# Patient Record
Sex: Female | Born: 1985 | Race: White | Hispanic: No | Marital: Married | Smoking: Never smoker
Health system: Southern US, Community
[De-identification: ages and names within clinical notes are randomized; demographics above are authoritative.]

## PROBLEM LIST (undated history)

## (undated) DIAGNOSIS — T7840XA Allergy, unspecified, initial encounter: Secondary | ICD-10-CM

## (undated) DIAGNOSIS — I10 Essential (primary) hypertension: Secondary | ICD-10-CM

## (undated) DIAGNOSIS — F419 Anxiety disorder, unspecified: Secondary | ICD-10-CM

## (undated) HISTORY — PX: CHOLECYSTECTOMY: SHX55

## (undated) HISTORY — DX: Allergy, unspecified, initial encounter: T78.40XA

## (undated) HISTORY — PX: TONSILLECTOMY: SUR1361

---

## 2011-07-18 DIAGNOSIS — F411 Generalized anxiety disorder: Secondary | ICD-10-CM | POA: Insufficient documentation

## 2011-07-18 DIAGNOSIS — I10 Essential (primary) hypertension: Secondary | ICD-10-CM | POA: Insufficient documentation

## 2014-12-13 DIAGNOSIS — J40 Bronchitis, not specified as acute or chronic: Secondary | ICD-10-CM | POA: Insufficient documentation

## 2014-12-13 DIAGNOSIS — J029 Acute pharyngitis, unspecified: Secondary | ICD-10-CM | POA: Insufficient documentation

## 2015-02-02 DIAGNOSIS — F411 Generalized anxiety disorder: Secondary | ICD-10-CM | POA: Insufficient documentation

## 2015-02-02 DIAGNOSIS — T7840XA Allergy, unspecified, initial encounter: Secondary | ICD-10-CM | POA: Insufficient documentation

## 2015-02-28 DIAGNOSIS — M5430 Sciatica, unspecified side: Secondary | ICD-10-CM | POA: Insufficient documentation

## 2015-04-24 DIAGNOSIS — J0100 Acute maxillary sinusitis, unspecified: Secondary | ICD-10-CM | POA: Insufficient documentation

## 2015-08-01 DIAGNOSIS — M79671 Pain in right foot: Secondary | ICD-10-CM | POA: Insufficient documentation

## 2015-08-01 DIAGNOSIS — R197 Diarrhea, unspecified: Secondary | ICD-10-CM | POA: Insufficient documentation

## 2015-08-01 DIAGNOSIS — R140 Abdominal distension (gaseous): Secondary | ICD-10-CM | POA: Insufficient documentation

## 2015-10-31 DIAGNOSIS — E669 Obesity, unspecified: Secondary | ICD-10-CM | POA: Insufficient documentation

## 2016-09-25 DIAGNOSIS — S86819A Strain of other muscle(s) and tendon(s) at lower leg level, unspecified leg, initial encounter: Secondary | ICD-10-CM | POA: Insufficient documentation

## 2017-04-16 NOTE — L&D Delivery Note (Signed)
Operative Delivery Note At 11:52 AM a viable and healthy female was delivered via Vaginal, [employer], operational(Extractor).  Presentation: vertex; Position: Occiput,, Anterior; Station: +3.  Verbal consent: obtained from patient.  Risks and benefits discussed in detail.  Risks include, but are not limited to the risks of anesthesia, bleeding, infection, damage to maternal tissues, fetal cephalhematoma.  There is also the risk of inability to effect vaginal delivery of the head, or shoulder dystocia that cannot be resolved by established maneuvers, leading to the need for emergency cesarean section.  APGAR: 8, 9; weight  pending.   Placenta status: spontaneous, intact.   Cord: shoulder cord with the following complications: none.  Cord pH: na  Anesthesia:  Epidural x 3 pulls, no pop offs Instruments: kiwi Episiotomy: None Lacerations: 2nd degree;Perineal Suture Repair: 2.0 vicryl rapide Est. Blood Loss (mL):  200  Mom to postpartum.  Baby to Couplet care / Skin to Skin.  Yiannis Tulloch J 04/09/2018, 12:05 PM

## 2017-09-23 DIAGNOSIS — Z3A10 10 weeks gestation of pregnancy: Secondary | ICD-10-CM | POA: Diagnosis not present

## 2017-09-23 DIAGNOSIS — O09891 Supervision of other high risk pregnancies, first trimester: Secondary | ICD-10-CM | POA: Diagnosis not present

## 2017-09-30 DIAGNOSIS — Z8679 Personal history of other diseases of the circulatory system: Secondary | ICD-10-CM | POA: Diagnosis not present

## 2017-10-07 DIAGNOSIS — O09291 Supervision of pregnancy with other poor reproductive or obstetric history, first trimester: Secondary | ICD-10-CM | POA: Diagnosis not present

## 2017-11-07 DIAGNOSIS — O09292 Supervision of pregnancy with other poor reproductive or obstetric history, second trimester: Secondary | ICD-10-CM | POA: Diagnosis not present

## 2017-11-07 DIAGNOSIS — Z3A16 16 weeks gestation of pregnancy: Secondary | ICD-10-CM | POA: Diagnosis not present

## 2017-12-19 DIAGNOSIS — O09292 Supervision of pregnancy with other poor reproductive or obstetric history, second trimester: Secondary | ICD-10-CM | POA: Diagnosis not present

## 2017-12-19 DIAGNOSIS — Z3A22 22 weeks gestation of pregnancy: Secondary | ICD-10-CM | POA: Diagnosis not present

## 2018-01-03 DIAGNOSIS — Z3A25 25 weeks gestation of pregnancy: Secondary | ICD-10-CM | POA: Diagnosis not present

## 2018-01-03 DIAGNOSIS — O23592 Infection of other part of genital tract in pregnancy, second trimester: Secondary | ICD-10-CM | POA: Diagnosis not present

## 2018-01-03 DIAGNOSIS — B9689 Other specified bacterial agents as the cause of diseases classified elsewhere: Secondary | ICD-10-CM | POA: Insufficient documentation

## 2018-01-03 DIAGNOSIS — N760 Acute vaginitis: Secondary | ICD-10-CM

## 2018-01-03 DIAGNOSIS — O26852 Spotting complicating pregnancy, second trimester: Secondary | ICD-10-CM | POA: Diagnosis not present

## 2018-01-03 DIAGNOSIS — O26859 Spotting complicating pregnancy, unspecified trimester: Secondary | ICD-10-CM

## 2018-01-03 NOTE — MAU Note (Signed)
Went to the bathroom around 1530, noted some red blood on the tissue. (dime spot). Happened a 2nd time, instructed to come here for eval.  Has felt movement all day. No pain.  Has been constipated

## 2018-01-03 NOTE — Discharge Instructions (Signed)
Bacterial Vaginosis Bacterial vaginosis is an infection of the vagina. It happens when too many germs (bacteria) grow in the vagina. This infection puts you at risk for infections from sex (STIs). Treating this infection can lower your risk for some STIs. You should also treat this if you are pregnant. It can cause your baby to be born early. Follow these instructions at home: Medicines  Take over-the-counter and prescription medicines only as told by your doctor.  Take or use your antibiotic medicine as told by your doctor. Do not stop taking or using it even if you start to feel better. General instructions  If you your sexual partner is a woman, tell her that you have this infection. She needs to get treatment if she has symptoms. If you have a female partner, he does not need to be treated.  During treatment: ? Avoid sex. ? Do not douche. ? Avoid alcohol as told. ? Avoid breastfeeding as told.  Drink enough fluid to keep your pee (urine) clear or pale yellow.  Keep your vagina and butt (rectum) clean. ? Wash the area with warm water every day. ? Wipe from front to back after you use the toilet.  Keep all follow-up visits as told by your doctor. This is important. Preventing this condition  Do not douche.  Use only warm water to wash around your vagina.  Use protection when you have sex. This includes: ? Latex condoms. ? Dental dams.  Limit how many people you have sex with. It is best to only have sex with the same person (be monogamous).  Get tested for STIs. Have your partner get tested.  Wear underwear that is cotton or lined with cotton.  Avoid tight pants and pantyhose. This is most important in summer.  Do not use any products that have nicotine or tobacco in them. These include cigarettes and e-cigarettes. If you need help quitting, ask your doctor.  Do not use illegal drugs.  Limit how much alcohol you drink. Contact a doctor if:  Your symptoms do not get  better, even after you are treated.  You have more discharge or pain when you pee (urinate).  You have a fever.  You have pain in your belly (abdomen).  You have pain with sex.  Your bleed from your vagina between periods. Summary  This infection happens when too many germs (bacteria) grow in the vagina.  Treating this condition can lower your risk for some infections from sex (STIs).  You should also treat this if you are pregnant. It can cause early (premature) birth.  Do not stop taking or using your antibiotic medicine even if you start to feel better. This information is not intended to replace advice given to you by your health care provider. Make sure you discuss any questions you have with your health care provider. Document Released: 01/10/2008 Document Revised: 12/17/2015 Document Reviewed: 12/17/2015 Elsevier Interactive Patient Education  2017 Elsevier Inc.  [employer] of the uterus can occur throughout pregnancy, but they are not always a sign that you are in labor. You may have practice contractions called Braxton Hicks contractions. These false labor contractions are sometimes confused with true labor. What are Deberah Pelton contractions? Braxton Hicks contractions are tightening movements that occur in the muscles of the uterus before labor. Unlike true labor contractions, these contractions do not result in opening (dilation) and thinning of the cervix. Toward the end of pregnancy (32-34 weeks), Braxton Hicks contractions can happen more often  and may become stronger. These contractions are sometimes difficult to tell apart from true labor because they can be very uncomfortable. You should not feel embarrassed if you go to the hospital with false labor. Sometimes, the only way to tell if you are in true labor is for your health care provider to look for changes in the cervix. The health care provider will do a physical exam and may monitor  your contractions. If you are not in true labor, the exam should show that your cervix is not dilating and your water has not broken. If there are other health problems associated with your pregnancy, it is completely safe for you to be sent home with false labor. You may continue to have Braxton Hicks contractions until you go into true labor. How to tell the difference between true labor and false labor True labor  Contractions last 30-70 seconds.  Contractions become very regular.  Discomfort is usually felt in the top of the uterus, and it spreads to the lower abdomen and low back.  Contractions do not go away with walking.  Contractions usually become more intense and increase in frequency.  The cervix dilates and gets thinner. False labor  Contractions are usually shorter and not as strong as true labor contractions.  Contractions are usually irregular.  Contractions are often felt in the front of the lower abdomen and in the groin.  Contractions may go away when you walk around or change positions while lying down.  Contractions get weaker and are shorter-lasting as time goes on.  The cervix usually does not dilate or become thin. Follow these instructions at home:  Take over-the-counter and prescription medicines only as told by your health care provider.  Keep up with your usual exercises and follow other instructions from your health care provider.  Eat and drink lightly if you think you are going into labor.  If Braxton Hicks contractions are making you uncomfortable: ? Change your position from lying down or resting to walking, or change from walking to resting. ? Sit and rest in a tub of warm water. ? Drink enough fluid to keep your urine pale yellow. Dehydration may cause these contractions. ? Do slow and deep breathing several times an hour.  Keep all follow-up prenatal visits as told by your health care provider. This is important. Contact a health care  provider if:  You have a fever.  You have continuous pain in your abdomen. Get help right away if:  Your contractions become stronger, more regular, and closer together.  You have fluid leaking or gushing from your vagina.  You pass blood-tinged mucus (bloody show).  You have bleeding from your vagina.  You have low back pain that you never had before.  You feel your babys head pushing down and causing pelvic pressure.  Your baby is not moving inside you as much as it used to. Summary  Contractions that occur before labor are called Braxton Hicks contractions, false labor, or practice contractions.  Braxton Hicks contractions are usually shorter, weaker, farther apart, and less regular than true labor contractions. True labor contractions usually become progressively stronger and regular and they become more frequent.  Manage discomfort from [facility] contractions by changing position, resting in a warm bath, drinking plenty of water, or practicing deep breathing. This information is not intended to replace advice given to you by your health care provider. Make sure you discuss any questions you have with your health care provider. Document Released: 08/16/2016  Document Revised: 08/16/2016 Document Reviewed: 08/16/2016 Elsevier Interactive Patient Education  [employer]2018 Elsevier Inc.

## 2018-01-03 NOTE — MAU Provider Note (Signed)
History     CSN: [id number]  Arrival date and time: 01/03/18 1640   First Provider Initiated Contact with Patient 01/03/18 1743      Chief Complaint  Patient presents with  . Vaginal Bleeding   HPI  Melissa Oneal is a 32 y.o. G1P0 at 6262w0d who presents to MAU today with complaint of 2 episodes of red spotting on the tissue today between 3 and 4pm. She denies any blood in her underwear. She has not needed a pad or panty liner. She denies abdominal pain, contractions or UTI symptoms. She has had frequent constipation. She reports normal fetal movement.   OB History    Gravida  1   Para      Term      Preterm      AB      Living        SAB      TAB      Ectopic      Multiple      Live Births              History reviewed. No pertinent past medical history.  Past Surgical History:  Procedure Laterality Date  . CHOLECYSTECTOMY    . TONSILLECTOMY      History reviewed. No pertinent family history.  Social History   Tobacco Use  . Smoking status: Never Smoker  . Smokeless tobacco: Never Used  Substance Use Topics  . Alcohol use: Never    Frequency: Never  . Drug use: Never    Allergies:  Allergies  Allergen Reactions  . Cephalosporins Rash    No medications prior to admission.    Review of Systems  Constitutional: Negative for fever.  Gastrointestinal: Positive for constipation. Negative for abdominal pain, diarrhea, nausea and vomiting.  Genitourinary: Positive for vaginal bleeding. Negative for dysuria, frequency, urgency and vaginal discharge.   Physical Exam   Blood pressure 118/75, pulse 77, temperature 98.5 F (36.9 C), temperature source Oral, resp. rate 16, weight 89.1 kg, SpO2 100 %.  Physical Exam  Nursing note and vitals reviewed. Constitutional: She is oriented to person, place, and time. She appears well-developed and well-nourished. No distress.  HENT:  Head: Normocephalic and atraumatic.  Cardiovascular: Normal  rate.  Respiratory: Effort normal.  GI: Soft. She exhibits no distension and no mass. There is no tenderness. There is no rebound and no guarding.  Genitourinary: Uterus is enlarged. Cervix exhibits no motion tenderness, no discharge and no friability. No bleeding in the vagina. Vaginal discharge (small white discharge) found.  Neurological: She is alert and oriented to person, place, and time.  Skin: Skin is warm and dry. No erythema.  Psychiatric: She has a normal mood and affect.   Results for orders placed or performed during the hospital encounter of 01/03/18 (from the past 24 hour(s))  Wet prep, genital     Status: Abnormal   Collection Time: 01/03/18  6:16 PM  Result Value Ref Range   Yeast Wet Prep HPF POC NONE SEEN NONE SEEN   Trich, Wet Prep NONE SEEN NONE SEEN   Clue Cells Wet Prep HPF POC PRESENT (A) NONE SEEN   WBC, Wet Prep HPF POC MANY (A) NONE SEEN   Sperm NONE SEEN   Urinalysis, Routine w reflex microscopic     Status: Abnormal   Collection Time: 01/03/18  6:50 PM  Result Value Ref Range   Color, Urine YELLOW YELLOW   APPearance CLEAR CLEAR   Specific Gravity, Urine  1.011 1.005 - 1.030   pH 6.0 5.0 - 8.0   Glucose, UA NEGATIVE NEGATIVE mg/dL   Hgb urine dipstick NEGATIVE NEGATIVE   Bilirubin Urine NEGATIVE NEGATIVE   Ketones, ur NEGATIVE NEGATIVE mg/dL   Protein, ur NEGATIVE NEGATIVE mg/dL   Nitrite NEGATIVE NEGATIVE   Leukocytes, UA TRACE (A) NEGATIVE   RBC / HPF 0-5 0 - 5 RBC/hpf   WBC, UA 0-5 0 - 5 WBC/hpf   Bacteria, UA RARE (A) NONE SEEN   Squamous Epithelial / LPF 0-5 0 - 5   Mucus PRESENT    Fetal Monitoring: Baseline: 135 bpm Variability: moderate Accelerations: 15 x 15, 10 x 10 Decelerations: none Contractions: none   MAU Course  Procedures None  MDM UA and wet prep today Discussed patient with Dr. Amado Nash. She recommends treatment of wet prep, pelvic rest and follow-up in the office next week.   Assessment and Plan  A: SIUP at  [redacted]w[redacted]d Bacterial vaginosis  Spotting in pregnancy, second trimester   P:  Discharge home Rx for Flagyl given to patient  Bleeding/preterm labor precautions discussed Pelvic rest advised  Patient advised to follow-up with [facility] as scheduled next week Patient may return to MAU as needed or if her condition were to change or worsen  Vonzella Nipple, PA-C 01/03/2018, 7:26 PM

## 2018-02-10 DIAGNOSIS — Z3A30 30 weeks gestation of pregnancy: Secondary | ICD-10-CM | POA: Diagnosis not present

## 2018-02-10 DIAGNOSIS — O368130 Decreased fetal movements, third trimester, not applicable or unspecified: Secondary | ICD-10-CM | POA: Diagnosis not present

## 2018-02-18 DIAGNOSIS — O368130 Decreased fetal movements, third trimester, not applicable or unspecified: Secondary | ICD-10-CM | POA: Diagnosis not present

## 2018-02-18 DIAGNOSIS — Z3A31 31 weeks gestation of pregnancy: Secondary | ICD-10-CM | POA: Diagnosis not present

## 2018-02-27 DIAGNOSIS — O09293 Supervision of pregnancy with other poor reproductive or obstetric history, third trimester: Secondary | ICD-10-CM | POA: Diagnosis not present

## 2018-02-27 DIAGNOSIS — Z3A32 32 weeks gestation of pregnancy: Secondary | ICD-10-CM | POA: Diagnosis not present

## 2018-03-17 DIAGNOSIS — O09293 Supervision of pregnancy with other poor reproductive or obstetric history, third trimester: Secondary | ICD-10-CM | POA: Diagnosis not present

## 2018-03-18 DIAGNOSIS — Z3400 Encounter for supervision of normal first pregnancy, unspecified trimester: Secondary | ICD-10-CM | POA: Diagnosis not present

## 2018-03-18 NOTE — Telephone Encounter (Signed)
Spoke with patient, she stated she was going to speak with her husband and call us back.

## 2018-03-18 NOTE — Telephone Encounter (Signed)
Copied from CRM [id number]. Topic: Appointment Scheduling - New Patient >> Mar 17, 2018  3:03 PM Elliot GaultBell, Tiffany M wrote: Relation to pt: self  Call back number: [phone]    Reason for call:  Lenny PastelMueller, Nicole S - [phone] patient of Dr. Selena BattenKim referred patient to establish care. Patient currently 35 weeks and she is under the care of her OBGYN, please advise

## 2018-03-18 NOTE — Telephone Encounter (Signed)
Please let her know I am part-time and pretty full right now.  If  this is issue would recommend 1 of our other providers, Dr. Salomon FickBanks, Dr. Ardyth HarpsHernandez or Dr. Hassan RowanKoberlein.  If she prefers to see me, would schedule new patient visit once she has recovered from the pregnancy.  Typically during the pregnancy her obstetrician would manage her concerns.

## 2018-03-31 DIAGNOSIS — O26893 Other specified pregnancy related conditions, third trimester: Secondary | ICD-10-CM | POA: Diagnosis not present

## 2018-03-31 DIAGNOSIS — Z3A37 37 weeks gestation of pregnancy: Secondary | ICD-10-CM | POA: Insufficient documentation

## 2018-03-31 DIAGNOSIS — R030 Elevated blood-pressure reading, without diagnosis of hypertension: Secondary | ICD-10-CM | POA: Insufficient documentation

## 2018-03-31 DIAGNOSIS — O163 Unspecified maternal hypertension, third trimester: Secondary | ICD-10-CM

## 2018-03-31 DIAGNOSIS — Z3689 Encounter for other specified antenatal screening: Secondary | ICD-10-CM

## 2018-03-31 HISTORY — DX: Anxiety disorder, unspecified: F41.9

## 2018-03-31 HISTORY — DX: Essential (primary) hypertension: I10

## 2018-03-31 NOTE — MAU Note (Signed)
Dr has been watching her blood pressure for a little bit, was up today called and was seen in the office, sent over for further eval.  Denies HA, visual changes or epigastric pain, some increase in swelling in hands at night.feet and ankles are now starting to swell

## 2018-03-31 NOTE — Discharge Instructions (Signed)

## 2018-03-31 NOTE — MAU Provider Note (Signed)
History     CSN: [id number]  Arrival date and time: 03/31/18 1255   First Provider Initiated Contact with Patient 03/31/18 1337      Chief Complaint  Patient presents with  . Hypertension   HPI Melissa Oneal is a 32 y.o. G1P0 at [redacted]w[redacted]d sent from clinic for elevated blood pressure. Patient states her [facility] Provider has been following her blood pressure for a couple weeks due to history of chronic hypertension which resolved with weight loss. Patient endorses blood pressure of 140/90 on two occasions last weekend as well as a headache which resolved without intervention. She was seen in clinic this morning for followup and determined to have blood pressure of 158/77. Patient denies headache, RUQ pain, SOB, visual disturbances, abdominal pain, DFM, fever or recent illness.  Patient also voices concern about nighttime swelling in her hands and intermittent bilateral swelling in her lower extremities. She denies facial swelling.  OB History    Gravida  1   Para      Term      Preterm      AB      Living        SAB      TAB      Ectopic      Multiple      Live Births              Past Medical History:  Diagnosis Date  . Anxiety   . Hypertension     Past Surgical History:  Procedure Laterality Date  . CHOLECYSTECTOMY    . TONSILLECTOMY      No family history on file.  Social History   Tobacco Use  . Smoking status: Never Smoker  . Smokeless tobacco: Never Used  Substance Use Topics  . Alcohol use: Never    Frequency: Never  . Drug use: Never    Allergies:  Allergies  Allergen Reactions  . Cephalosporins Rash    Medications Prior to Admission  Medication Sig Dispense Refill Last Dose  . metroNIDAZOLE (FLAGYL) 500 MG tablet Take 1 tablet (500 mg total) by mouth 2 (two) times daily. 14 tablet 0     Review of Systems  Constitutional: Negative for chills, fatigue and fever.  HENT: Negative for facial swelling.   Respiratory: Negative for  shortness of breath.   Gastrointestinal: Negative for abdominal pain, nausea and vomiting.  Genitourinary: Negative for difficulty urinating, dyspareunia and flank pain.  Musculoskeletal: Negative for back pain.  Neurological: Negative for dizziness, syncope and headaches.  All other systems reviewed and are negative.  Physical Exam   Blood pressure 137/88, pulse 92, temperature 98.5 F (36.9 C), temperature source Oral, resp. rate 18, height 5\' 6"  (1.676 m), weight 99.5 kg, SpO2 100 %.  Physical Exam  Nursing note and vitals reviewed. Constitutional: She is oriented to person, place, and time. She appears well-developed and well-nourished.  Cardiovascular: Normal rate, normal heart sounds and intact distal pulses.  Respiratory: Effort normal and breath sounds normal.  GI: Soft. She exhibits no distension. There is no abdominal tenderness. There is no rebound and no guarding.  Gravid  Genitourinary:    Vagina and uterus normal.     No vaginal discharge.   Musculoskeletal: Normal range of motion.  Neurological: She is oriented to person, place, and time.  Skin: Skin is warm and dry.  Psychiatric: She has a normal mood and affect. Her behavior is normal. Judgment and thought content normal.   Cervix Closed/thick/posterior  MAU  Course/MDM   --Reactive fetal tracing: baseline 140, moderate variability, positive accelerations, no decelerations --Toco: uterine irritability, rare contraction not felt by patient --Elevated BP x 2, no severe range. No severe symptoms --Discussed HPI and lab results with Dr. Juliene PinaMody. Plan for discharge with continued home blood pressure readings. Patient has OB appt Thursday but should call clinic or present to MAU for elevated BP or occurrence of severe symptoms --Elevated WBCs discussed with patient and Dr. Juliene PinaMody. Patient denies illness, urinary symptoms, fever  Patient Vitals for the past 24 hrs:  BP Temp Temp src Pulse Resp SpO2 Height Weight  03/31/18  1516 129/83 - - 88 - - - -  03/31/18 1501 137/88 - - 86 - - - -  03/31/18 1456 135/77 - - 85 - - - -  03/31/18 1431 138/87 - - 90 - - - -  03/31/18 1416 139/83 - - 88 - - - -  03/31/18 1401 134/81 - - 86 - - - -  03/31/18 1346 (!) 141/76 - - 89 - - - -  03/31/18 1331 137/88 - - 92 - - - -  03/31/18 1316 (!) 146/88 98.5 F (36.9 C) Oral 89 18 100 % 5\' 6"  (1.676 m) 99.5 kg    Results for orders placed or performed during the hospital encounter of 03/31/18 (from the past 24 hour(s))  Urinalysis, Routine w reflex microscopic     Status: Abnormal   Collection Time: 03/31/18  1:27 PM  Result Value Ref Range   Color, Urine YELLOW YELLOW   APPearance CLEAR CLEAR   Specific Gravity, Urine <1.005 (L) 1.005 - 1.030   pH 6.0 5.0 - 8.0   Glucose, UA NEGATIVE NEGATIVE mg/dL   Hgb urine dipstick NEGATIVE NEGATIVE   Bilirubin Urine NEGATIVE NEGATIVE   Ketones, ur 15 (A) NEGATIVE mg/dL   Protein, ur NEGATIVE NEGATIVE mg/dL   Nitrite NEGATIVE NEGATIVE   Leukocytes, UA MODERATE (A) NEGATIVE  Protein / creatinine ratio, urine     Status: None   Collection Time: 03/31/18  1:27 PM  Result Value Ref Range   Creatinine, Urine 60.00 mg/dL   Total Protein, Urine 6 mg/dL   Protein Creatinine Ratio 0.10 0.00 - 0.15 mg/mg[Cre]  Urinalysis, Microscopic (reflex)     Status: Abnormal   Collection Time: 03/31/18  1:27 PM  Result Value Ref Range   RBC / HPF 0-5 0 - 5 RBC/hpf   WBC, UA 6-10 0 - 5 WBC/hpf   Bacteria, UA FEW (A) NONE SEEN   Squamous Epithelial / LPF 6-10 0 - 5  CBC with Differential/Platelet     Status: Abnormal   Collection Time: 03/31/18  1:58 PM  Result Value Ref Range   WBC 15.0 (H) 4.0 - 10.5 K/uL   RBC 3.62 (L) 3.87 - 5.11 MIL/uL   Hemoglobin 11.6 (L) 12.0 - 15.0 g/dL   HCT 40.933.7 (L) 81.136.0 - 91.446.0 %   MCV 93.1 80.0 - 100.0 fL   MCH 32.0 26.0 - 34.0 pg   MCHC 34.4 30.0 - 36.0 g/dL   RDW 78.213.5 95.611.5 - 21.315.5 %   Platelets 218 150 - 400 K/uL   nRBC 0.0 0.0 - 0.2 %   Neutrophils Relative %  72 %   Neutro Abs 10.9 (H) 1.7 - 7.7 K/uL   Lymphocytes Relative 21 %   Lymphs Abs 3.1 0.7 - 4.0 K/uL   Monocytes Relative 6 %   Monocytes Absolute 0.9 0.1 - 1.0 K/uL   Eosinophils Relative  1 %   Eosinophils Absolute 0.1 0.0 - 0.5 K/uL   Basophils Relative 0 %   Basophils Absolute 0.1 0.0 - 0.1 K/uL  Comprehensive metabolic panel     Status: Abnormal   Collection Time: 03/31/18  1:58 PM  Result Value Ref Range   Sodium 136 135 - 145 mmol/L   Potassium 3.1 (L) 3.5 - 5.1 mmol/L   Chloride 106 98 - 111 mmol/L   CO2 22 22 - 32 mmol/L   Glucose, Bld 120 (H) 70 - 99 mg/dL   BUN 6 6 - 20 mg/dL   Creatinine, Ser 1.61 0.44 - 1.00 mg/dL   Calcium 8.6 (L) 8.9 - 10.3 mg/dL   Total Protein 5.5 (L) 6.5 - 8.1 g/dL   Albumin 3.1 (L) 3.5 - 5.0 g/dL   AST 17 15 - 41 U/L   ALT 12 0 - 44 U/L   Alkaline Phosphatase 63 38 - 126 U/L   Total Bilirubin 0.8 0.3 - 1.2 mg/dL   GFR calc non Af Amer >60 >60 mL/min   GFR calc Af Amer >60 >60 mL/min   Anion gap 8 5 - 15     Assessment and Plan  --32 y.o. G1P0 at [redacted]w[redacted]d  --Reactive fetal tracing, closed cervix --Negative PEC labs --Concerning signs and symptoms reviewed with patient and restated in AVS --Discharge home in stable condition   F/U: Existing appt Thursday 04/03/18  Plan of care discussed with and approved by Dr. Rande Lawman, CNM 03/31/2018, 4:34 PM

## 2018-04-09 DIAGNOSIS — Z3A38 38 weeks gestation of pregnancy: Secondary | ICD-10-CM | POA: Diagnosis not present

## 2018-04-09 DIAGNOSIS — O99344 Other mental disorders complicating childbirth: Secondary | ICD-10-CM | POA: Diagnosis present

## 2018-04-09 DIAGNOSIS — F419 Anxiety disorder, unspecified: Secondary | ICD-10-CM | POA: Diagnosis present

## 2018-04-09 DIAGNOSIS — O9081 Anemia of the puerperium: Secondary | ICD-10-CM | POA: Diagnosis not present

## 2018-04-09 DIAGNOSIS — Z3A39 39 weeks gestation of pregnancy: Secondary | ICD-10-CM | POA: Diagnosis not present

## 2018-04-09 DIAGNOSIS — O139 Gestational [pregnancy-induced] hypertension without significant proteinuria, unspecified trimester: Principal | ICD-10-CM | POA: Diagnosis present

## 2018-04-09 DIAGNOSIS — D62 Acute posthemorrhagic anemia: Secondary | ICD-10-CM | POA: Diagnosis not present

## 2018-04-09 DIAGNOSIS — Z3483 Encounter for supervision of other normal pregnancy, third trimester: Secondary | ICD-10-CM | POA: Diagnosis not present

## 2018-04-09 MED ADMIN — Lactated Ringer's Solution: INTRAVENOUS | @ 09:00:00 | NDC 00338011704

## 2018-04-09 MED ADMIN — Lactated Ringer's Solution: INTRAVENOUS | @ 06:00:00 | NDC 00338011704

## 2018-04-09 NOTE — MAU Note (Signed)
Pt reporting SROM at 0245 clear/pink. Denies bleeding, has had period-like cramps for weeks. +FM.  Is scheduled for IOL 12-26 for GHTN.

## 2018-04-09 NOTE — Anesthesia Preprocedure Evaluation (Signed)
Anesthesia Evaluation  Patient identified by MRN, date of birth, ID band Patient awake    Reviewed: Allergy & Precautions, Patient's Chart, lab work & pertinent test results  Airway Mallampati: II  TM Distance: >3 FB     Dental   Pulmonary neg pulmonary ROS,    breath sounds clear to auscultation       Cardiovascular hypertension,  Rhythm:Regular Rate:Normal     Neuro/Psych negative neurological ROS     GI/Hepatic negative GI ROS, Neg liver ROS,   Endo/Other  negative endocrine ROS  Renal/GU negative Renal ROS     Musculoskeletal   Abdominal   Peds  Hematology negative hematology ROS (+)   Anesthesia Other Findings   Reproductive/Obstetrics (+) Pregnancy                             Lab Results  Component Value Date   WBC 16.9 (H) 04/09/2018   HGB 12.6 04/09/2018   HCT 36.8 04/09/2018   MCV 92.7 04/09/2018   PLT 257 04/09/2018    Anesthesia Physical Anesthesia Plan  ASA: II  Anesthesia Plan: Epidural   Post-op Pain Management:    Induction:   PONV Risk Score and Plan: Treatment may vary due to age or medical condition  Airway Management Planned: Natural Airway  Additional Equipment:   Intra-op Plan:   Post-operative Plan:   Informed Consent: I have reviewed the patients History and Physical, chart, labs and discussed the procedure including the risks, benefits and alternatives for the proposed anesthesia with the patient or authorized representative who has indicated his/her understanding and acceptance.     Plan Discussed with:   Anesthesia Plan Comments:         Anesthesia Quick Evaluation

## 2018-04-09 NOTE — Lactation Note (Signed)
This note was copied from a baby's chart. Lactation Consultation Note  Patient Name: Melissa Oneal ZOXWR'UToday's Date: 04/09/2018 Reason for consult: Initial assessment;1st time breastfeeding;Early term 37-38.6wks P1, 9 hour female infant. Per mom, she attended breastfeeding classes in her pregnancy. Per mom, has Medela DEBP which she brought with her from home. Per mom, infant did not latch during L&D. Infant has two emesis episodes with LC, infant was suction and burped, emesis was large , frothy and with mucous. LC alerted nurse of emesis. Mom was given a manual pump and explained how to use by nurse. Mom hand expressed 1 ml of colostrum that was spoon feed to infant. LC notice mom's right breast is short shafted, [facility] and explained how to use [facility], mom knows to wear in bra during the day and not sleep in breast shells during  the night. Mom latched infant to left breast using the foot ball hold, infant open mouth with wide gape with chin expended downward, swallowing observed by LC infant was breastfeeding for 10 minutes. Infant was still breastfeeding as LC left room.  Mom will breastfeed according hunger cues, 8 to 12 times within 24 hours and not exceed 3 hours without breastfeeding infant. Mom made aware of O/P services, breastfeeding support groups, community resources, and our phone # for post-discharge questions.  Mom's breastfeeding plan: 1. Mom will breastfeed according hunger cues, 8 to 12 times within 24 hours and not exceed 3 hours without breastfeeding infant. 2. Mom will continue to do STS as much as possible. 3. Mom call nurse and LC if she has any further questions, concerns or need assistance with latching infant to breast.    Maternal Data Formula Feeding for Exclusion: Yes Reason for exclusion: Mother's choice to formula and breast feed on admission Has patient been taught Hand Expression?: Yes(Mom has colostrum present both breast.) Does  the patient have breastfeeding experience prior to this delivery?: No  Feeding Feeding Type: Breast Fed  LATCH Score Latch: Grasps breast easily, tongue down, lips flanged, rhythmical sucking.  Audible Swallowing: Spontaneous and intermittent  Type of Nipple: Everted at rest and after stimulation(right breast is short shafted )  Comfort (Breast/Nipple): Soft / non-tender  Hold (Positioning): Assistance needed to correctly position infant at breast and maintain latch.  LATCH Score: 9  Interventions Interventions: Breast feeding basics reviewed;Pre-pump if needed;Assisted with latch;Skin to skin;Breast compression;Breast massage;Adjust position;Shells;Hand pump  Lactation Tools Discussed/Used WIC Program: No Pump Review: Setup, frequency, and cleaning;Milk Storage Initiated by:: by Nurse Date initiated:: 04/10/18   Consult Status Consult Status: Follow-up Date: 04/10/18 Follow-up type: In-patient    Melissa Oneal 04/09/2018, 9:37 PM

## 2018-04-09 NOTE — Anesthesia Postprocedure Evaluation (Signed)
Anesthesia Post Note  Patient: Melissa RanaHeather Oneal  Procedure(s) Performed: AN AD HOC LABOR EPIDURAL     Patient location during evaluation: Mother Baby Anesthesia Type: Epidural Level of consciousness: awake and alert Pain management: pain level controlled Vital Signs Assessment: post-procedure vital signs reviewed and stable Respiratory status: spontaneous breathing, nonlabored ventilation and respiratory function stable Cardiovascular status: stable Postop Assessment: no headache, no backache and epidural receding Anesthetic complications: no    Last Vitals:  Vitals:   04/09/18 1332 04/09/18 1415  BP: 128/75 130/86  Pulse: 86 85  Resp: 18 18  Temp:  37.2 C  SpO2:  96%    Last Pain:  Vitals:   04/09/18 1415  TempSrc:   PainSc: 0-No pain   Pain Goal:                 Kennadee Walthour

## 2018-04-09 NOTE — MAU Note (Signed)
Phoned Dr Billy Coastaavon regarding [facility]'s BP [facility] He ordered CMP and other standard labor admission orders.  [facility]

## 2018-04-09 NOTE — Anesthesia Procedure Notes (Signed)
Epidural Patient location during procedure: OB Start time: 04/09/2018 6:28 AM End time: 04/09/2018 6:34 AM  Staffing Anesthesiologist: Marcene DuosFitzgerald, Cameo Schmiesing, MD Performed: anesthesiologist   Preanesthetic Checklist Completed: patient identified, site marked, surgical consent, pre-op evaluation, timeout performed, IV checked, risks and benefits discussed and monitors and equipment checked  Epidural Patient position: right lateral decubitus Prep: site prepped and draped and DuraPrep Patient monitoring: continuous pulse ox and blood pressure Approach: midline Location: L4-L5 Injection technique: LOR air  Needle:  Needle type: Tuohy  Needle gauge: 17 G Needle length: 9 cm and 9 Needle insertion depth: 6 cm Catheter type: closed end flexible Catheter size: 19 Gauge Catheter at skin depth: 11 cm Test dose: negative  Assessment Events: blood not aspirated, injection not painful, no injection resistance, negative IV test and no paresthesia

## 2018-04-09 NOTE — H&P (Signed)
Melissa Oneal is a 32 y.o. female presenting for labor. OB History    Gravida  1   Para      Term      Preterm      AB      Living        SAB      TAB      Ectopic      Multiple      Live Births             Past Medical History:  Diagnosis Date  . Anxiety   . Hypertension    Past Surgical History:  Procedure Laterality Date  . CHOLECYSTECTOMY    . TONSILLECTOMY     Family History: family history is not on file. Social History:  reports that she has never smoked. She has never used smokeless tobacco. She reports that she does not drink alcohol or use drugs.     Maternal Diabetes: No Genetic Screening: Normal Maternal Ultrasounds/Referrals: Normal Fetal Ultrasounds or other Referrals:  None Maternal Substance Abuse:  No Significant Maternal Medications:  None Significant Maternal Lab Results:  None Other Comments:  None  Review of Systems  Constitutional: Negative.   All other systems reviewed and are negative.  Maternal Medical History:  Reason for admission: Contractions.   Contractions: Onset was 3-5 hours ago.   Frequency: irregular.   Perceived severity is moderate.    Fetal activity: Perceived fetal activity is normal.   Last perceived fetal movement was within the past hour.    Prenatal complications: PIH.   Prenatal Complications - Diabetes: none.    CBC    Component Value Date/Time   WBC 16.9 (H) 04/09/2018 0513   RBC 3.97 04/09/2018 0513   HGB 12.6 04/09/2018 0513   HCT 36.8 04/09/2018 0513   PLT 257 04/09/2018 0513   MCV 92.7 04/09/2018 0513   MCH 31.7 04/09/2018 0513   MCHC 34.2 04/09/2018 0513   RDW 13.6 04/09/2018 0513   LYMPHSABS 3.1 03/31/2018 1358   MONOABS 0.9 03/31/2018 1358   EOSABS 0.1 03/31/2018 1358   BASOSABS 0.1 03/31/2018 1358   CMP     Component Value Date/Time   NA 137 04/09/2018 0513   K 3.8 04/09/2018 0513   CL 107 04/09/2018 0513   CO2 18 (L) 04/09/2018 0513   GLUCOSE 84 04/09/2018 0513   BUN 11 04/09/2018 0513   CREATININE 0.52 04/09/2018 0513   CALCIUM 9.5 04/09/2018 0513   PROT 6.1 (L) 04/09/2018 0513   ALBUMIN 3.2 (L) 04/09/2018 0513   AST 16 04/09/2018 0513   ALT 13 04/09/2018 0513   ALKPHOS 75 04/09/2018 0513   BILITOT 1.3 (H) 04/09/2018 0513   GFRNONAA >60 04/09/2018 0513   GFRAA >60 04/09/2018 0513    Dilation: 10 Effacement (%): 100 Station: Plus 1 Exam by:: Melissa Oneal Blood pressure 139/86, pulse 74, temperature 97.9 F (36.6 C), temperature source Oral, resp. rate 17, height 5\' 6"  (1.676 m), weight 98.5 kg, SpO2 100 %. Exam Physical Exam  Prenatal labs: ABO, Rh:   Antibody:   Rubella: Immune (06/21 0000) RPR: Nonreactive (06/21 0000)  HBsAg: Negative (06/21 0000)  HIV: Non-reactive (06/21 0000)  GBS: Negative (12/02 0000)   Assessment/Plan: Term IUP Active labor Remote history of HTN No s/s PEC Admit   Melissa Oneal 04/09/2018, 6:36 AM

## 2018-04-10 NOTE — Lactation Note (Signed)
This note was copied from a baby's chart. Lactation Consultation Note  Patient Name: Melissa Ethel RanaHeather Segar ZOXWR'UToday's Date: 04/10/2018 Reason for consult: Follow-up assessment;Difficult latch;Early term 37-38.6wks P1, 36 hour female infant. Per mom, she has been wearing breast shells. LC notice nipple shaft was extended out more today and breast felt more fuller.  Mom has sore nipples  with few abrasions from infant not latch well earlier. LC gave mom comfort gels and explained how to use. Nurse will give coconut oil.  Mom latched infant left breast using cross cradle position, wide mouth open gape and audible swallowing heard by parents and LC. Infant breastfeeding for 10 minutes and still breastfeeding as LC left room.  Mom will call Nurse or LC if she has any further questions, concerns or need assistance with latching infant to breast.   Maternal Data    Feeding    LATCH Score Latch: Grasps breast easily, tongue down, lips flanged, rhythmical sucking.  Audible Swallowing: Spontaneous and intermittent  Type of Nipple: Everted at rest and after stimulation(short shafted wearing breast shells. )  Comfort (Breast/Nipple): Filling, red/small blisters or bruises, mild/mod discomfort  Hold (Positioning): Assistance needed to correctly position infant at breast and maintain latch.  LATCH Score: 8  Interventions Interventions: Breast compression;Breast massage;Hand express;Support pillows;Adjust position;Assisted with latch;Breast feeding basics reviewed;Position options;Expressed milk  Lactation Tools Discussed/Used     Consult Status Consult Status: Follow-up Date: 04/11/18 Follow-up type: In-patient    Danelle EarthlyRobin Amare Bail 04/10/2018, 11:54 PM

## 2018-04-10 NOTE — Lactation Note (Signed)
This note was copied from a baby's chart. Lactation Consultation Note  Patient Name: Boy Ethel RanaHeather Mcmains ZOXWR'UToday's Date: 04/10/2018  Telephone calf from mom reporting infant cuing.  Arrived and dad holding infant and mom eating.  Mom reported that he wouldn't latch when they tried to feed him because they think he was trying to poop.  Plan to change him and try to breastfeed on their own.  Will call if they need assistance.   Maternal Data    Feeding    LATCH Score                   Interventions    Lactation Tools Discussed/Used     Consult Status      Brent Taillon Michaelle CopasS Zuly Belkin 04/10/2018, 7:47 PM

## 2018-04-10 NOTE — Lactation Note (Signed)
This note was copied from a baby's chart. Lactation Consultation Note  Patient Name: Boy Ethel RanaHeather Wilkinson ZOXWR'UToday's Date: 04/10/2018  Telephone call from RN.  Mom requesting to be seen by lactation.  Per mom infant circumsized around 1 pm.  Infant has not woke up to feed.  No hunger cues.  Urged mom to keep STS and call when cuing. Left name and number white board.    Maternal Data    Feeding    LATCH Score                   Interventions    Lactation Tools Discussed/Used     Consult Status      Stella Encarnacion Michaelle CopasS Lourene Hoston 04/10/2018, 7:41 PM

## 2018-04-10 NOTE — Progress Notes (Signed)
Post Partum Day 1, VAVD, 2nd degr lac. BOY  Subjective: no complaints, up ad lib, voiding, tolerating PO and + flatus  Objective: Blood pressure 109/75, pulse 67, temperature 98.6 F (37 C), temperature source Oral, resp. rate 18, height 5\' 6"  (1.676 m), weight 98.5 kg, SpO2 99 %. Patient Vitals for the past 24 hrs:  BP Temp Temp src Pulse Resp SpO2  04/10/18 0551 109/75 98.6 F (37 C) Oral 67 18 99 %  04/10/18 0056 111/70 98.6 F (37 C) Oral 78 20 -  04/09/18 2232 (!) 128/91 98.2 F (36.8 C) Oral (!) 102 14 100 %  04/09/18 1924 120/77 98.7 F (37.1 C) Oral 71 16 97 %  04/09/18 1521 126/77 98.9 F (37.2 C) Oral 91 19 -  04/09/18 1415 130/86 99 F (37.2 C) - 85 18 96 %  04/09/18 1332 128/75 - - 86 18 -  04/09/18 1316 133/78 - - 86 17 -  04/09/18 1255 - 100.3 F (37.9 C) Axillary - - -   Physical Exam:  General: alert and cooperative Lochia: appropriate Uterine Fundus: firm Incision: healing well, no concerns reported DVT Evaluation: No evidence of DVT seen on physical exam.  CBC Latest Ref Rng & Units 04/10/2018 04/09/2018 03/31/2018  WBC 4.0 - 10.5 K/uL 19.4(H) 16.9(H) 15.0(H)  Hemoglobin 12.0 - 15.0 g/dL 10.2(L) 12.6 11.6(L)  Hematocrit 36.0 - 46.0 % 29.8(L) 36.8 33.7(L)  Platelets 150 - 400 K/uL 210 257 218    Assessment/Plan: PPD #1, VAVD, 2nd degr lac Routine PP care, peri-care Remote h/o HTN but none in pregnancy, few elevated BPs at admission and in labor, none since delivery, no neural s/s. Reportable s/s reviewed Lactation support  BOY- circ planned, informed written consent obtained    LOS: 1 day   Robley FriesVaishali R Tatsuo Musial 04/10/2018, 12:32 PM

## 2018-04-11 DIAGNOSIS — D62 Acute posthemorrhagic anemia: Secondary | ICD-10-CM

## 2018-04-11 NOTE — Lactation Note (Addendum)
This note was copied from a baby's chart. Lactation Consultation Note  Patient Name: Melissa Ethel RanaHeather Loria WGNFA'OToday's Date: 04/11/2018 Reason for consult: Follow-up assessment;Early term 3537-38.6wks  P1 mother whose infant is now 8246 hours old.  This is an ETI at 38+5 weeks  Baby was sucking on father's finger when I arrived.  Mother stated that she had been trying to awaken baby but he would not wake up.  Baby has not fed well since his circumcision was completed yesterday.  Offered to assist with latch and mother accepted.  Assisted baby to latch in the football hold on the right breast.  With gentle stimulation he started sucking and audible swallows noted.  Discussed with mother how to position and hold at the breast, how to obtain a deep latch and how to do intermittent breast compressions during feedings.  Baby continued to suck effectively for approximately 15 minutes with gentle stimulation.  Mother was pleased to see him feeding so well.  Encouraged mother to feed 8-12 times/24 hours or sooner if baby shows cues.  Explained that baby would probably cluster feed tonight and to allow baby to feed as often as he desires.  Mother verbalized understanding.    Mother has a DEBP and a manual pump for home use.  She has comfort gels at bedside.  Reviewed instructions for use.  Mother will continue to use EBM after feeding for nipples/areolas.  She has our OP phone number for questions/concerns after discharge. Engorgement prevention/treatment discussed.  Father present and very helpful. Both parents are receptive to learning.    Discussed pumping with mother since she will be returning to work in 8 weeks.  Allowed time for questions.     Maternal Data Formula Feeding for Exclusion: No Has patient been taught Hand Expression?: Yes Does the patient have breastfeeding experience prior to this delivery?: No  Feeding Feeding Type: Breast Fed  LATCH Score Latch: Grasps breast easily, tongue down,  lips flanged, rhythmical sucking.  Audible Swallowing: Spontaneous and intermittent  Type of Nipple: Everted at rest and after stimulation  Comfort (Breast/Nipple): Filling, red/small blisters or bruises, mild/mod discomfort  Hold (Positioning): Assistance needed to correctly position infant at breast and maintain latch.  LATCH Score: 8  Interventions Interventions: Breast feeding basics reviewed;Assisted with latch;Skin to skin;Breast massage;Hand express;Breast compression;Hand pump;Comfort gels;Shells;Expressed milk;Position options;Support pillows;Adjust position;DEBP  Lactation Tools Discussed/Used WIC Program: No   Consult Status Consult Status: Complete Date: 04/11/18 Follow-up type: Call as needed    Ilhan Debenedetto R Onofre Gains 04/11/2018, 10:37 AM

## 2018-04-11 NOTE — Discharge Summary (Signed)
Obstetric Discharge Summary   Patient Name: Melissa Oneal DOB: 07/30/1985 MRN: [id number]  Date of Admission: 04/09/2018 Date of Discharge: 04/11/2018 Date of Delivery: 04/09/18 Gestational Age at Delivery: 4013w0d  Primary OB: [facility] - Dr. Juliene PinaMody  Antepartum complications:  - Remote hx of HTN - resolved with weight loss - PIH - mild range BPs at the end of pregnancy  - Anxiety Prenatal Labs:  ABO, Rh:  O Pos Antibody:  Negative   Rubella: Immune (06/21 0000) RPR: Nonreactive (06/21 0000)  HBsAg: Negative (06/21 0000)  HIV: Non-reactive (06/21 0000)  GBS: Negative (12/02 0000) Admitting Diagnosis: Active labor at term   Secondary Diagnoses: Patient Active Problem List   Diagnosis Date Noted  . Vacuum-assisted vaginal delivery 04/11/2018  . Postpartum care following VAVD (12/25) 04/11/2018  . Second degree perineal laceration 04/11/2018  . Acute blood loss anemia 04/11/2018  . Normal labor 04/09/2018    Augmentation: None Complications: None  Date of Delivery: 04/09/18 Delivered By: Dr. Billy Coastaavon Delivery Type: VAVD Anesthesia: epidural Placenta: sponatneous Laceration: 2nd degree Episiotomy: none  Newborn Data: Live born female  Birth Weight: 7 lb 9.3 oz (3440 g) APGAR: ,   Newborn Delivery   Birth date/time:  04/09/2018 11:52:00 Delivery type:  Vaginal, Vacuum (Extractor)        Hospital/Postpartum Course  (Vaginal Delivery): Pt. Admitted for active labor at term.  See notes and delivery summary for details. Patient had an uncomplicated postpartum course.  By time of discharge on PPD#2, her pain was controlled on oral pain medications; she had appropriate lochia and was ambulating, voiding without difficulty and tolerating regular diet.  She was deemed stable for discharge to home.     Labs: CBC Latest Ref Rng & Units 04/10/2018 04/09/2018 03/31/2018  WBC 4.0 - 10.5 K/uL 19.4(H) 16.9(H) 15.0(H)  Hemoglobin 12.0 - 15.0 g/dL 10.2(L) 12.6 11.6(L)   Hematocrit 36.0 - 46.0 % 29.8(L) 36.8 33.7(L)  Platelets 150 - 400 K/uL 210 257 218   Conflict (See Lab Report): O POS/O POS Performed [facility], [address] [place], [place] [address]   Physical exam:  BP 118/72 (BP Location: Left Arm)   Pulse (!) 58   Temp 98.8 F (37.1 C) (Oral)   Resp 16   Ht 5\' 6"  (1.676 m)   Wt 98.5 kg   SpO2 100%   BMI 35.04 kg/m  General: alert and no distress Pulm: normal respiratory effort Lochia: appropriate Abdomen: soft, NT Uterine Fundus: firm, below umbilicus Perineum: healing well, no significant erythema, +edema Extremities: No evidence of DVT seen on physical exam. No lower extremity edema.   Disposition: stable, discharge to home Baby Feeding: breast milk Baby Disposition: home with mom  Contraception: not discussed  Rh Immune globulin given: N/A Rubella vaccine given: N/A Tdap vaccine given in AP or PP setting: UTD Flu vaccine given in AP or PP setting: UTD   Plan:  Melissa RanaHeather Nash was discharged to home in good condition. [facility]. Family connects nurse visit in 1 week for BP check.   Discharge Instructions: Per After Visit Summary. Refer to After Visit Summary and [facility]  Activity: Advance as tolerated. Pelvic rest for 6 weeks.   Diet: Regular, Heart Healthy Discharge Medications: Allergies as of 04/11/2018      Reactions   Cephalosporins Rash      Medication List    STOP taking these medications   metroNIDAZOLE 500 MG tablet Commonly known as:  FLAGYL     TAKE these medications   acetaminophen 500 MG tablet Commonly known as:  TYLENOL Take 500 mg by mouth every 8 (eight) hours as needed for mild pain (sore throat).   aspirin EC 81 MG tablet Take 81 mg by mouth daily.   docusate sodium 100 MG capsule Commonly known as:  COLACE Take 100 mg by mouth 2 (two) times daily.   ibuprofen 600 MG tablet Commonly known as:   ADVIL,MOTRIN Take 1 tablet (600 mg total) by mouth every 6 (six) hours.   polyethylene glycol powder powder Commonly known as:  GLYCOLAX/MIRALAX Take 1 Container by mouth once.   prenatal multivitamin Tabs tablet Take 1 tablet by mouth daily at 12 noon.            Discharge Care Instructions  (From admission, onward)         Start     Ordered   04/11/18 0000  Discharge wound care:    Comments:  Warm water sitz baths 2-3 times per day; ice packs as needed   04/11/18 1110         Outpatient follow up:  Follow-up Information    Shea EvansMody, Vaishali, MD. Schedule an appointment as soon as possible for a visit in 1 week(s).   Specialty:  Obstetrics and Gynecology Why:  [facility]; then postpartum visit with Dr. Juliene PinaMody in 6 weeks  Contact information: [address] [place] [address] [phone]           Signed:  Carlean JewsMeredith Arrie Borrelli, MSN, CNM [facility]

## 2018-04-11 NOTE — Progress Notes (Signed)
PPD #1, VAVD, baby boy "Melissa Oneal"  S:  Reports feeling okay, ready to be discharged home; reports having a cold for 1.5 weeks - cough and sore throat - using Cepacol and neti pot with some relief. No fevers/chills   Denies HA, visual changes, RUQ/epigastric pain              Tolerating po/ No nausea or vomiting / Denies dizziness or SOB             Bleeding is light             Pain controlled with Motrin             Up ad lib / ambulatory / voiding QS  Newborn breast feeding colostrum going better today; cluster feeding / Circumcision - completed yesterday  O:               VS: BP 118/72 (BP Location: Left Arm)   Pulse (!) 58   Temp 98.8 F (37.1 C) (Oral)   Resp 16   Ht 5\' 6"  (1.676 m)   Wt 98.5 kg   SpO2 100%   BMI 35.04 kg/m   Date/Time  Temp  Pulse  ECG Heart Rate  Resp  BP  Patient Position (if appropriate)  SpO2  O2 Device  Weight Who   04/10/18 22:38:30  98.8 F (37.1 C)  58Abnormal   -  16  118/72  Lying  100 %  Room Air  - CG   04/10/18 14:23:59  98.6 F (37 C)  72  -  16  128/82  Sitting  -  -  - EB   04/10/18 0551  98.6 F (37 C)  67  -  18  109/75  Semi-fowlers  99 %  -  - RK   04/10/18 0056  98.6 F (37 C)  78  -  20  111/70  Semi-fowlers             LABS:              Recent Labs    04/09/18 0513 04/10/18 0543  WBC 16.9* 19.4*  HGB 12.6 10.2*  PLT 257 210               Blood type: --/--/O POS, O POS Performed at [facility], [address] [place], [place] [address]  [phone](12/25 [phone])  Rubella: Immune, Immune, Immune (06/21 0000)                     I&O: Intake/Output      12/26 0701 - 12/27 0700 12/27 0701 - 12/28 0700   Urine (mL/kg/hr)     Blood     Total Output     Net                        Physical Exam:             Alert and oriented X3  Lungs: Clear and unlabored  Heart: regular rate and rhythm / no murmurs  Abdomen: soft, non-tender, non-distended              Fundus: firm, non-tender, U-1  Perineum: well approximated 2nd degree  repair, mild edema noted; no ecchymosis or erythema   Lochia: small, no clots   Extremities: no edema, no calf pain or tenderness    A: PPD # 1, VAVD   2nd degree repair   ABL Anemia - stable,  asymptomatic  Remote Hx. Of HTN - few elevated BPs at admission and in labor - stable now, no neural s/s   Doing well - stable status  P: Routine post partum orders  Discharge home   [facility] discharge book and warning s/s and instructions reviewed  Pre-eclampsia s/s reviewed   Continue BBASA x 2 weeks postpartum   F/u with worsening s/s of URI  Family connects nurse visit in 1 week for BP check, then 6 weeks PP visit with Dr. Corky CraftsMody   Meredith Sigmon, MSN, CNM [facility]

## 2018-04-15 DIAGNOSIS — I10 Essential (primary) hypertension: Secondary | ICD-10-CM | POA: Diagnosis not present

## 2018-04-15 DIAGNOSIS — O1003 Pre-existing essential hypertension complicating the puerperium: Secondary | ICD-10-CM | POA: Insufficient documentation

## 2018-04-15 DIAGNOSIS — R030 Elevated blood-pressure reading, without diagnosis of hypertension: Secondary | ICD-10-CM | POA: Diagnosis not present

## 2018-04-15 NOTE — Discharge Instructions (Signed)
Postpartum Hypertension  Postpartum hypertension is high blood pressure that remains higher than normal after childbirth. You may not realize that you have postpartum hypertension if your blood pressure is not being checked regularly. In most cases, postpartum hypertension will go away on its own, usually within a week of delivery. However, for some women, medical treatment is required to prevent serious complications, such as seizures or stroke.  What are the causes?  This condition may be caused by one or more of the following:   Hypertension that existed before pregnancy (chronic hypertension).   Hypertension that comes on as a result of pregnancy (gestational hypertension).   Hypertensive disorders during pregnancy (preeclampsia) or seizures in women who have high blood pressure during pregnancy (eclampsia).   A condition in which the liver, platelets, and red blood cells are damaged during pregnancy (HELLP syndrome).   A condition in which the thyroid produces too much hormones (hyperthyroidism).   Other rare problems of the nerves (neurological disorders) or blood disorders.  In some cases, the cause may not be known.  What increases the risk?  The following factors may make you more likely to develop this condition:   Chronic hypertension. In some cases, this may not have been diagnosed before pregnancy.   Obesity.   Type 2 diabetes.   Kidney disease.   History of preeclampsia or eclampsia.   Other medical conditions that change the level of hormones in the body (hormonal imbalance).  What are the signs or symptoms?  As with all types of hypertension, postpartum hypertension may not have any symptoms. Depending on how high your blood pressure is, you may experience:   Headaches. These may be mild, moderate, or severe. They may also be steady, constant, or sudden in onset (thunderclap headache).   Changes in your ability to see (visual changes).   Dizziness.   Shortness of breath.   Swelling  of your hands, feet, lower legs, or face. In some cases, you may have swelling in more than one of these locations.   Heart palpitations or a racing heartbeat.   Difficulty breathing while lying down.   Decrease in the amount of urine that you pass.  Other rare signs and symptoms may include:   Sweating more than usual. This lasts longer than a few days after delivery.   Chest pain.   Sudden dizziness when you get up from sitting or lying down.   Seizures.   Nausea or vomiting.   Abdominal pain.  How is this diagnosed?  This condition may be diagnosed based on the results of a physical exam, blood pressure measurements, and blood and urine tests.  You may also have other tests, such as a CT scan or an MRI, to check for other problems of postpartum hypertension.  How is this treated?  If blood pressure is high enough to require treatment, your options may include:   Medicines to reduce blood pressure (antihypertensives). Tell your health care provider if you are breastfeeding or if you plan to breastfeed. There are many antihypertensive medicines that are safe to take while breastfeeding.   Stopping medicines that may be causing hypertension.   Treating medical conditions that are causing hypertension.   Treating the complications of hypertension, such as seizures, stroke, or kidney problems.  Your health care provider will also continue to monitor your blood pressure closely until it is within a safe range for you.  Follow these instructions at home:   Take over-the-counter and prescription medicines only as   told by your health care provider.   Return to your normal activities as told by your health care provider. Ask your health care provider what activities are safe for you.   Do not use any products that contain nicotine or tobacco, such as cigarettes and e-cigarettes. If you need help quitting, ask your health care provider.   Keep all follow-up visits as told by your health care provider. This  is important.  Contact a health care provider if:   Your symptoms get worse.   You have new symptoms, such as:  ? A headache that does not get better.  ? Dizziness.  ? Visual changes.  Get help right away if:   You suddenly develop swelling in your hands, ankles, or face.   You have sudden, rapid weight gain.   You develop difficulty breathing, chest pain, racing heartbeat, or heart palpitations.   You develop severe pain in your abdomen.   You have any symptoms of a stroke. "BE FAST" is an easy way to remember the main warning signs of a stroke:  ? B - Balance. Signs are dizziness, sudden trouble walking, or loss of balance.  ? E - Eyes. Signs are trouble seeing or a sudden change in vision.  ? F - Face. Signs are sudden weakness or numbness of the face, or the face or eyelid drooping on one side.  ? A - Arms. Signs are weakness or numbness in an arm. This happens suddenly and usually on one side of the body.  ? S - Speech. Signs are sudden trouble speaking, slurred speech, or trouble understanding what people say.  ? T - Time. Time to call emergency services. Write down what time symptoms started.   You have other signs of a stroke, such as:  ? A sudden, severe headache with no known cause.  ? Nausea or vomiting.  ? Seizure.  These symptoms may represent a serious problem that is an emergency. Do not wait to see if the symptoms will go away. Get medical help right away. Call your local emergency services (911 in the [place] Do not drive yourself to the hospital.  Summary   Postpartum hypertension is high blood pressure that remains higher than normal after childbirth.   In most cases, postpartum hypertension will go away on its own, usually within a week of delivery.   For some women, medical treatment is required to prevent serious complications, such as seizures or stroke.  This information is not intended to replace advice given to you by your health care provider. Make sure you discuss any questions  you have with your health care provider.  Document Released: 12/04/2013 Document Revised: 01/21/2017 Document Reviewed: 01/21/2017  [employer]vier Interactive Patient Education  2019 [employer]

## 2018-04-15 NOTE — Lactation Note (Signed)
This note was copied from a baby's chart. 04/15/2018  Name: Melissa Oneal MRN: [id number] Date of Birth: 04/09/2019 Gestational Age: Gestational Age: 2752w5d Birth Weight: 121.3 oz Weight today:    7 pounds 5.2 ounces (3322 grams) with clean newborn diaper  6 day old early term infant presents today with mom and dad for feeding assessment. Infant lost weight and has started supplementing with formula on Sunday 12/29.   Infant has gained 62 grams in the last 4 days with an average daily weight gain of 16 grams a day. Infant lost down to 6 pounds 11 ounces on 12/29 per parents, infant has gained 10 ounces in the last 2 days.   Infant latched to the right breast in the foot ball hold. Infant with very little effort in suckling at the breast. Mom's nipple is flattened and asymmetrical post feeding. 5 french feeding tube placed and infant suckled better but not consistently. We then placed # 24 NS and relatched with the 5 french feeding tube, infant only transferred if tube was being pushed. Feeding was stopped at 20 minutes.   Infant was bottle fed using a Dr. Theora GianottiBrown's nipple. Parents were shown how to pace bottle feed and dad finished feeding. Infant drooled on the bottle some.   Mom with wide spaced cone shaped breasts. She reports breast growth with pregnancy. Mom does have HTN and was sent to [facility] Admissions this morning to be evaluated. Mom reports she is awaiting a call from her OB. Mom has not been engorged and the most she has pumped is 15 ml per pumping. Adjusted her down to size 24 flanges.   Mom asked about how to increase milk supply. Enc hands on pumping and hand expression post pumping. Enc eating and plenty of fluids. Discussed eating oatmeal. Mom asked about Fenugreek, discussed Fenugreek vs Mother Love More Milk Plus. Asked mom to please speak with OB prior to taking due to elevated BP. Mom denies peanut allergy or asthma. Discussed with mom that there is a small percentage  of women who do not obtain an adequate milk volume, although it is too early to tell.   Infant to follow up with Dr. Luna FuseEttefagh today for weight check. Infant to follow up with Lactation in 1 week. Mom aware of BF Support Groups. Mom to call with questions/concerns as needed.      General Information: Mother's reason for visit: Feeding assessment Consult: Initial Lactation consultant: Noralee StainSharon  RN,IBCLC Breastfeeding experience: Frustrated at the breast, latching for short periods Maternal medical conditions: Pregnancy induced hypertension(mom reports her BP is currently high and is being monitored. ) Maternal medications: Pre-natal vitamin, Motrin (ibuprofen), Other(baby Aspirin)  Breastfeeding History: Frequency of breast feeding: every 3 hours Duration of feeding: few sucks  Supplementation: Supplement method: bottle(Dr. Brown's) Brand: Similac Formula volume: 2-2.5 ounces Formula frequency: every 2-3 hours   Breast milk volume: 15 ml Breast milk frequency: every 3 hours   Pump type: Medela pump in style Pump frequency: every 3 hours Pump volume: 15 ml  Infant Output Assessment: Voids per 24 hours: 8-10 Urine color: Clear yellow Stools per 24 hours: 3 Stool color: Yellow  Breast Assessment: Breast: Soft, Compressible, Widely spaced, Other(cone shaped) Nipple: Erect Pain level: 2 Pain interventions: Bra, Other(Nipple Butter)  Feeding Assessment: Infant oral assessment: Variance Infant oral assessment comment: Infant with thick labial frenulum that inserts at the bottom of the gum ridge. Upper lip with some resistance with feeding. Infant keep upper lip curled in on the  breast and the bottle. Infant chomping on the bottle and the breast. Mom's nipple was compressed and asymmetrical after latch. Infant pulls on and off the breast, he sustained latch better with the NS. Infant with tongue thrusting on the breast. Infant did not transfer well at the breast even with the 5  french feeding tube. Infant with strong suckle on gloved finger with good tongue cupping and extension. Infant with short posterior lingual frenulum causing decreased mid tongue elevation. Parents informed of how tongue restriction can effect milk supply and transfer. Parents given website and local provider information. infant with divot to center of upper gum ridge. Dad has a gap between his teeth. Discussed with parents these are normal variations of mouth development.  Positioning: Football(right breast) Latch: 1 - Repeated attempts needed to sustain latch, nipple held in mouth throughout feeding, stimulation needed to elicit sucking reflex. Audible swallowing: 1 - A few with stimulation Type of nipple: 2 - Everted at rest and after stimulation Comfort: 1 - Filling, red/small blisters or bruises, mild/mod discomfort Hold: 1 - Assistance needed to correctly position infant at breast and maintain latch LATCH score: 6 Latch assessment: Deep Lips flanged: No(upper lip needs flanging) Suck assessment: Displays both Tools: Syringe with 5 Fr feeding tube Pre-feed weight: 3322 grams Post feed weight: 3330 grams Amount transferred: 0 Amount supplemented: 8 ml EBM via 5 french feeding tube  Additional Feeding Assessment: Infant oral assessment: Variance Infant oral assessment comment: see above Positioning: Football(right breast) Latch: 1 - Repeated attempts neede to sustain latch, nipple held in mouth throughout feeding, stimulation needed to elicit sucking reflex. Audible swallowing: 1 - A few with stimulation Type of nipple: 2 - Everted at rest and after stimulation Comfort: 1 - Filling, red/small blisters or bruises, mild/mod discomfort Hold: 1 - Assistance needed to correctly position infant at breast and maintain latch LATCH score: 6 Latch assessment: Deep Lips flanged: Yes Suck assessment: Displays both Tools: Nipple shield 24 mm, Syringe with 5 Fr feeding tube Pre-feed weight: 3330  grams Post feed weight: 3348 grams Amount transferred: 5 ml Amount supplemented: 13 ml  Totals: Total amount transferred: 5 Total supplement given: 25 Total amount pumped post feed: 10 ml  1. Offer infant the breast with feeding cues, Try to breast feed at the breast at least 4 x a day. Limit feeding to 20 minutes if infant not actively feeding at the breast. Until infant is at birthweight, make sure infant 2. Use the # 24 Nipple Shield with feeding as needed to maintain latch, goal is to wean off as soon as infant able. Try each day without it to see when he can latch without it. Watch the nipple after feeding and make sure it is rounded 3. Can use the 5 french feeding tube at the breast to supplement infant as able 4. Offer infant bottle of pumped breast milk or formula after breast feeding, especially if he is cueing to feed 5. Use the Dr. Theora GianottiBrown's bottle with feeding 6. Feed infant using the paced bottle feeding method (video on [email or web address]) 7. Infant needs about 62-83 ml (2-3 ounces) for 8 feedings a day or 495-660 ml (17-22 ml) in 24 hours.  Infant may eat more or less depending on how often he feeds. Feed infant until he is satisfied.  8. Continue pumping about 8 x a day for about 15 minutes with your double electric breast pump. Pump for about 2 minutes after the milk stops flowing. Hand express after pumping.  9. Keep up the good work  10. Thank you for allowing me to assist you today 11. Please call with any questions/concerns as needed [phone] 12. Follow up with Lactation in 1 week (January 7th at 2 pm)    Ed Blalock RN, Rich Brave  04/15/2018, 9:19 AM

## 2018-04-15 NOTE — MAU Note (Signed)
PT SAYS VAG DEL ON 04-09-2018-  FOR LAST 3-4 WEEKS OF PREG- BP UP.  HAD WORK-UP - ALL GOOD. NO MEDS AT HOME AFTER DEL.   YESTERDAY - H/A- TOOK 600MG  IBUPROFEN ART 9PM.   AT 0110- AWAKE TO PUMP - H/A, AND BP WAS 161/104.    IN TRIAGE-  NO H/A, BLURRED VISION EPIGASTRIC PAIN.

## 2018-04-17 DIAGNOSIS — O165 Unspecified maternal hypertension, complicating the puerperium: Secondary | ICD-10-CM | POA: Diagnosis not present

## 2018-04-22 DIAGNOSIS — O165 Unspecified maternal hypertension, complicating the puerperium: Secondary | ICD-10-CM | POA: Diagnosis not present

## 2018-04-22 NOTE — Lactation Note (Signed)
This note was copied from a baby's chart. 04/22/2018  Name: Melissa Oneal MRN: [id number] Date of Birth: 04/09/2019 Gestational Age: Gestational Age: 1730w5d Birth Weight: 121.3 oz Weight today:    7 pounds 11.6 ounces (3504 grams) with clean newborn diaper  7913 day old ET infant presents today with mom for follow up feeding assessment. Mom has had a death in the family and has mostly pumped and bottle fed recently.   Infant has gained 182 grams in the last 7 days with an average daily weight gain of 26 grams a day.   Mom reports infant is not nursing well at the breast. He is being supplemented with formula and breast milk via Dr. Theora GianottiBrown's bottle. Infant is getting about 2/3 breast milk and 1/3 formula per mom.   Infant with very large soft yellow stool in the office.   Mom attempted to latch infant without the nipple shield, infant not able to sustain latch and nipple asymmetrical post latch. Mom applied nipple shield and infant latched easily with active feeding noted. Infant sleepy towards end of the feeding but overall did well. Mom did well with independently feeding infant. Infant did latch on and eat more after feeding was finished and transferred and additional 10 ml.    Mom would like to try to increase milk supply. Mom to ask OB about Fenugreek and has information on Fenugreek from previous encounter. Mom with some breast growth but is wearing the same bras as prepregnancy sizes, enc her to consider Mother's Love More Milk Special Blend.   Infant to follow up with Pediatrician the end of January. Mom aware of BF Support Groups. Infant to follow up with Lactationas needed or 1-5 days post tongue and lip releases if completed. Mom to call and check with insurance to see what is covered. Mom knows she can come to support groups and do a pre and post weight also.        General Information: Mother's reason for visit: Follow up feeding assessment Consult: Follow-up Lactation  consultant: Noralee StainSharon Pressley Barsky RN,IBCLC Breastfeeding experience: latching with the nipple Shield Maternal medical conditions: Pregnancy induced hypertension(BP is still high but improved) Maternal medications: Pre-natal vitamin, Motrin (ibuprofen), Other(Baby Aspiring, Procardia, Lebetalol)  Breastfeeding History: Frequency of breast feeding: every 2-3 hours Duration of feeding: few sucks without NS, feeds better with the NS in place  Supplementation: Supplement method: bottle(Dr. Brown's ) Brand: Similac Formula volume: 2-3 ounces Formula frequency: 2-3 x a day   Breast milk volume: 2-3 ounces Breast milk frequency: 5-6 x a day   Pump type: Medela pump in style Pump frequency: 7-8 x a day Pump volume: 60 ml  Infant Output Assessment: Voids per 24 hours: 8+ Urine color: Clear yellow Stools per 24 hours: 1-3 Stool color: Yellow  Breast Assessment: Breast: Soft, Compressible, Widely spaced(Cone Shaped) Nipple: Erect Pain level: 1 Pain interventions: Bra, Other(Nipple Butter)  Feeding Assessment:   Infant oral assessment comment: Infant with thick labial frenulum that insertgs at the bottom of the gum ridge with slight divot to center gum ridge. infant wtih strong suckle on gloved finger with good tongue cupping and extension. infant with short posterior lingual frenulum with some decreased mid tongue elevation. Nipple is asymmetrical post latch. infant has difficulty maintaining latch without the NS. infant more active at the breast today with better transfer. Dad has a gap between his teeth and mom reports she had a labial frenulum released as a teenager. Mom plans to speak wtih Pediatrician. (  infant with red ring around upper lip post feeding. ) Positioning: Cross cradle(left breast, 15 minutes) Latch: 2 - Grasps breast easily, tongue down, lips flanged, rhythmical sucking. Audible swallowing: 2 - Spontaneous and intermittent Type of nipple: 2 - Everted at rest and after  stimulation Comfort: 2 - Soft/non-tender Hold: 2 - No assistance needed to correctly position infant at breast LATCH score: 10 Latch assessment: Deep Lips flanged: Yes Suck assessment: Displays both Tools: Nipple shield 24 mm Pre-feed weight: 3468 grams Post feed weight: 3504 grams Amount transferred: 36 ml Amount supplemented: 0  Additional Feeding Assessment: Infant oral assessment: Variance Infant oral assessment comment: see above Positioning: Cross cradle(right breast) Latch: 1 - Repeated attempts neede to sustain latch, nipple held in mouth throughout feeding, stimulation needed to elicit sucking reflex. Audible swallowing: 2 - Spontaneous and intermittent Type of nipple: 2 - Everted at rest and after stimulation Comfort: 1 - Filling, red/small blisters or bruises, mild/mod discomfort Hold: 2 - No assistance needed to correctly position infant at breast LATCH score: 8 Latch assessment: Deep Lips flanged: Yes Suck assessment: Displays both Tools: Nipple shield 24 mm Pre-feed weight: 3504 grams Post feed weight: 3510 grams Amount transferred: 6 ml Amount supplemented: 0  Totals: Total amount transferred: 42 ml Total supplement given: 0 Total amount pumped post feed: did not pump   Plan:  1. Offer infant the breast with feeding cues, Try to breast feed at the breast at least 4 x a day. Limit feeding to 20 minutes if infant not actively feeding at the breast. 2. Use the # 24 Nipple Shield with feeding as needed to maintain latch, goal is to wean off as soon as infant able. Try each day without it to see when he can latch without it. Watch the nipple after feeding and make sure it is rounded.  3. Offer infant bottle of pumped breast milk or formula after breast feeding, especially if he is cueing to feed 4. Use the Dr. Theora GianottiBrown's bottle with feeding 5. Feed infant using the paced bottle feeding method (video on [email or web address]) 6. Infant needs about 66-88 ml (2.5-3 ounces) for  8 feedings a day or 525-700 ml (18-23 ml) in 24 hours.  Infant may eat more or less depending on how often he feeds. Feed infant until he is satisfied.  7. Continue pumping about 6-8 x a day for about 15 minutes with your double electric breast pump. Pump for about 2 minutes after the milk stops flowing. Hand express after pumping. It is recommended that you pump each time infant gets a bottle.  8. Keep up the good work  9. Thank you for allowing me to assist you today 10. Please call with any questions/concerns as needed [phone] 11. Follow up with Lactation as needed or 1-5 days post tongue and lip releases if completed.     Silas FloodSharon S Stevie Charter RN, IBCLC                                                    Silas FloodSharon S Antonella Upson 04/22/2018, 2:16 PM

## 2018-05-09 VITALS — BP 106/78 | HR 62 | Temp 98.3°F | Ht 66.0 in | Wt 191.0 lb

## 2018-05-09 DIAGNOSIS — Z7689 Persons encountering health services in other specified circumstances: Secondary | ICD-10-CM

## 2018-05-09 DIAGNOSIS — O165 Unspecified maternal hypertension, complicating the puerperium: Secondary | ICD-10-CM

## 2018-05-09 NOTE — Progress Notes (Signed)
Patient presents to clinic today to f/u on ongoing issues and establish care.  Pt accompanied by her 16 month old son.  SUBJECTIVE: PMH:  Pt is a 33 yo female with pmh sig for HTN.  Pt previously seen in [place], [place].  HTN: -pt delivered a healthy baby boy on 04/09/18.  Currently breastfeeding and formula feeding -pt endorses elevated bp during her third trimester.  Went into labor prior to induction -prior to pregnancy told bp was elevated, but never put on meds -taking procardia xl 30 mg and labetalol 100 mg BID -Has 6 wk postpartum visit in a few wks with OB/Gyn. -checking bp at home.  Typically 130s/80s -trying to drink more water. -denies HAs, changes in vision, CP, dizziness  Allergies: Cephalosporins  Past Surg Hx: Cholecystectomy 2013 Tonsillectomy 2003  Social Hx: Pt is married.  Pt has a 43 month old son.  Pt works as an Pensions consultant for [employer]. Pt denies EtOH, tobacco, and drug use.    Health Maintenance: Dental --Dr. Lendon Ka Immunizations --influenza vaccine 2019, Tdap 2019 PAP -- 2019  Family Med Hx: Mom-early onset dementia at age 64, HTN Dad-hearing loss, HLD, HTN MGM-arthritis, DM, dementia MGF-MI PGM-arthritis, DM PGF-cancer, MI, heart dz.   Past Medical History:  Diagnosis Date  . Anxiety   . Hypertension     Past Surgical History:  Procedure Laterality Date  . CHOLECYSTECTOMY    . TONSILLECTOMY      Current Outpatient Medications on File Prior to Visit  Medication Sig Dispense Refill  . aspirin EC 81 MG tablet Take 81 mg by mouth daily.    Marland Kitchen docusate sodium (COLACE) 100 MG capsule Take 100 mg by mouth 2 (two) times daily.    Marland Kitchen labetalol (NORMODYNE) 100 MG tablet Take 100 mg by mouth. Take 2 tablet twice daily    . NIFEdipine (PROCARDIA-XL/NIFEDICAL-XL) 30 MG 24 hr tablet Take 30 mg by mouth daily.    . polyethylene glycol powder (GLYCOLAX/MIRALAX) powder Take 1 Container by mouth once.    . Prenatal Vit-Fe Fumarate-FA (PRENATAL MULTIVITAMIN)  TABS tablet Take 1 tablet by mouth daily at 12 noon.     No current facility-administered medications on file prior to visit.     Allergies  Allergen Reactions  . Cephalosporins Rash    Family History  Problem Relation Age of Onset  . Miscarriages / [place] Mother   . Cancer Paternal Aunt   . Hypertension Maternal Grandfather   . Diabetes Maternal Grandfather   . Cancer Paternal Grandmother   . Heart disease Paternal Grandmother   . Hypertension Paternal Grandmother   . Cancer Paternal Grandfather   . Diabetes Paternal Grandfather     Social History   Socioeconomic History  . Marital status: Married    Spouse name: Not on file  . Number of children: Not on file  . Years of education: Not on file  . Highest education level: Not on file  Occupational History  . Not on file  Social Needs  . Financial resource strain: Not on file  . Food insecurity:    Worry: Not on file    Inability: Not on file  . Transportation needs:    Medical: Not on file    Non-medical: Not on file  Tobacco Use  . Smoking status: Never Smoker  . Smokeless tobacco: Never Used  Substance and Sexual Activity  . Alcohol use: Never    Frequency: Never  . Drug use: Never  . Sexual activity: Yes  Birth control/protection: None  Lifestyle  . Physical activity:    Days per week: Not on file    Minutes per session: Not on file  . Stress: Not on file  Relationships  . Social connections:    Talks on phone: Not on file    Gets together: Not on file    Attends religious service: Not on file    Active member of club or organization: Not on file    Attends meetings of clubs or organizations: Not on file    Relationship status: Not on file  . Intimate partner violence:    Fear of current or ex partner: Not on file    Emotionally abused: Not on file    Physically abused: Not on file    Forced sexual activity: Not on file  Other Topics Concern  . Not on file  Social History Narrative  .  Not on file    ROS General: Denies fever, chills, night sweats, changes in weight, changes in appetite HEENT: Denies headaches, ear pain, changes in vision, rhinorrhea, sore throat CV: Denies CP, palpitations, SOB, orthopnea Pulm: Denies SOB, cough, wheezing GI: Denies abdominal pain, nausea, vomiting, diarrhea, constipation GU: Denies dysuria, hematuria, frequency, vaginal discharge Msk: Denies muscle cramps, joint pains Neuro: Denies weakness, numbness, tingling Skin: Denies rashes, bruising Psych: Denies depression, anxiety, hallucinations   BP 106/78 (BP Location: Left Arm, Patient Position: Sitting, Cuff Size: Large)   Pulse 62   Temp 98.3 F (36.8 C) (Oral)   Ht 5\' 6"  (1.676 m)   Wt 191 lb (86.6 kg)   SpO2 94%   BMI 30.83 kg/m   Breast feeding.  Physical Exam Gen. Pleasant, well developed, well-nourished, in NAD HEENT - [place]/AT, PERRL, no scleral icterus, no nasal drainage, pharynx without erythema or exudate.  TMs normal b/l Neck: No JVD, no thyromegaly, no carotid bruits Lungs: no use of accessory muscles, CTAB, no wheezes, rales or rhonchi Cardiovascular: RRR, No r/g/m, no peripheral edema Musculoskeletal: No deformities, moves all four extremities, no cyanosis or clubbing, normal tone Neuro:  A&Ox3, CN II-XII intact, normal gait Skin:  Warm, dry, intact, no lesions  Recent Results (from the past 2160 hour(s))  OB RESULT CONSOLE Group B Strep     Status: None   Collection Time: 03/17/18 12:00 AM  Result Value Ref Range   GBS Negative   Urinalysis, Routine w reflex microscopic     Status: Abnormal   Collection Time: 03/31/18  1:27 PM  Result Value Ref Range   Color, Urine YELLOW YELLOW   APPearance CLEAR CLEAR   Specific Gravity, Urine <1.005 (L) 1.005 - 1.030   pH 6.0 5.0 - 8.0   Glucose, UA NEGATIVE NEGATIVE mg/dL   Hgb urine dipstick NEGATIVE NEGATIVE   Bilirubin Urine NEGATIVE NEGATIVE   Ketones, ur 15 (A) NEGATIVE mg/dL   Protein, ur NEGATIVE NEGATIVE  mg/dL   Nitrite NEGATIVE NEGATIVE   Leukocytes, UA MODERATE (A) NEGATIVE    Comment: Performed at [facility], [address], [place], [place] [address]  Protein / creatinine ratio, urine     Status: None   Collection Time: 03/31/18  1:27 PM  Result Value Ref Range   Creatinine, Urine 60.00 mg/dL   Total Protein, Urine 6 mg/dL    Comment: NO NORMAL RANGE ESTABLISHED FOR THIS TEST   Protein Creatinine Ratio 0.10 0.00 - 0.15 mg/mg[Cre]    Comment: Performed at [facility], [address], [place], [place] [address]  Urinalysis, Microscopic (reflex)  Status: Abnormal   Collection Time: 03/31/18  1:27 PM  Result Value Ref Range   RBC / HPF 0-5 0 - 5 RBC/hpf   WBC, UA 6-10 0 - 5 WBC/hpf   Bacteria, UA FEW (A) NONE SEEN   Squamous Epithelial / LPF 6-10 0 - 5    Comment: Performed at [facility], [address] [place], [place] [address]  CBC with Differential/Platelet     Status: Abnormal   Collection Time: 03/31/18  1:58 PM  Result Value Ref Range   WBC 15.0 (H) 4.0 - 10.5 K/uL   RBC 3.62 (L) 3.87 - 5.11 MIL/uL   Hemoglobin 11.6 (L) 12.0 - 15.0 g/dL   HCT 60.433.7 (L) 54.036.0 - 98.146.0 %   MCV 93.1 80.0 - 100.0 fL   MCH 32.0 26.0 - 34.0 pg   MCHC 34.4 30.0 - 36.0 g/dL   RDW 19.113.5 47.811.5 - 29.515.5 %   Platelets 218 150 - 400 K/uL   nRBC 0.0 0.0 - 0.2 %   Neutrophils Relative % 72 %   Neutro Abs 10.9 (H) 1.7 - 7.7 K/uL   Lymphocytes Relative 21 %   Lymphs Abs 3.1 0.7 - 4.0 K/uL   Monocytes Relative 6 %   Monocytes Absolute 0.9 0.1 - 1.0 K/uL   Eosinophils Relative 1 %   Eosinophils Absolute 0.1 0.0 - 0.5 K/uL   Basophils Relative 0 %   Basophils Absolute 0.1 0.0 - 0.1 K/uL    Comment: Performed at [facility], [address] [place], [place] [address]  Comprehensive metabolic panel     Status: Abnormal   Collection Time: 03/31/18  1:58 PM  Result Value Ref Range   Sodium 136 135 - 145 mmol/L   Potassium 3.1 (L) 3.5 - 5.1 mmol/L   Chloride 106 98 - 111 mmol/L   CO2  22 22 - 32 mmol/L   Glucose, Bld 120 (H) 70 - 99 mg/dL   BUN 6 6 - 20 mg/dL   Creatinine, Ser 8.650.50 0.44 - 1.00 mg/dL   Calcium 8.6 (L) 8.9 - 10.3 mg/dL   Total Protein 5.5 (L) 6.5 - 8.1 g/dL   Albumin 3.1 (L) 3.5 - 5.0 g/dL   AST 17 15 - 41 U/L   ALT 12 0 - 44 U/L   Alkaline Phosphatase 63 38 - 126 U/L   Total Bilirubin 0.8 0.3 - 1.2 mg/dL   GFR calc non Af Amer >60 >60 mL/min   GFR calc Af Amer >60 >60 mL/min   Anion gap 8 5 - 15    Comment: Performed at [facility], [address] [place], [place] [address]  Comprehensive metabolic panel     Status: Abnormal   Collection Time: 04/09/18  5:13 AM  Result Value Ref Range   Sodium 137 135 - 145 mmol/L   Potassium 3.8 3.5 - 5.1 mmol/L   Chloride 107 98 - 111 mmol/L   CO2 18 (L) 22 - 32 mmol/L   Glucose, Bld 84 70 - 99 mg/dL   BUN 11 6 - 20 mg/dL   Creatinine, Ser 6.290.52 0.44 - 1.00 mg/dL   Calcium 9.5 8.9 - 52.810.3 mg/dL   Total Protein 6.1 (L) 6.5 - 8.1 g/dL   Albumin 3.2 (L) 3.5 - 5.0 g/dL   AST 16 15 - 41 U/L   ALT 13 0 - 44 U/L   Alkaline Phosphatase 75 38 - 126 U/L   Total Bilirubin 1.3 (H) 0.3 - 1.2 mg/dL  GFR calc non Af Amer >60 >60 mL/min   GFR calc Af Amer >60 >60 mL/min   Anion gap 12 5 - 15    Comment: Performed at [facility], [address] [place], [place] [address]  CBC     Status: Abnormal   Collection Time: 04/09/18  5:13 AM  Result Value Ref Range   WBC 16.9 (H) 4.0 - 10.5 K/uL   RBC 3.97 3.87 - 5.11 MIL/uL   Hemoglobin 12.6 12.0 - 15.0 g/dL   HCT 60.436.8 54.036.0 - 98.146.0 %   MCV 92.7 80.0 - 100.0 fL   MCH 31.7 26.0 - 34.0 pg   MCHC 34.2 30.0 - 36.0 g/dL   RDW 19.113.6 47.811.5 - 29.515.5 %   Platelets 257 150 - 400 K/uL   nRBC 0.0 0.0 - 0.2 %    Comment: Performed at [facility], [address] [place], [place] [address]  RPR     Status: None   Collection Time: 04/09/18  5:13 AM  Result Value Ref Range   RPR Ser Ql Non Reactive Non Reactive    Comment: (NOTE) Performed At: [facility] [address] [place], [place] [id number] Jolene SchimkeNagendra Sanjai MD EX:[id number]:[phone]   POCT fern test     Status: Abnormal   Collection Time: 04/09/18  5:25 AM  Result Value Ref Range   POCT Fern Test Positive = ruptured amniotic membanes   Type and screen     Status: None   Collection Time: 04/09/18  7:07 AM  Result Value Ref Range   ABO/RH(D) O POS    Antibody Screen NEG    Sample Expiration      04/12/2018 Performed at [facility], [address] [place], [place] [address]   ABO/Rh     Status: None   Collection Time: 04/09/18  7:07 AM  Result Value Ref Range   ABO/RH(D)      O POS Performed at [facility], [address] [place], [place] [address]   CBC     Status: Abnormal   Collection Time: 04/10/18  5:43 AM  Result Value Ref Range   WBC 19.4 (H) 4.0 - 10.5 K/uL   RBC 3.19 (L) 3.87 - 5.11 MIL/uL   Hemoglobin 10.2 (L) 12.0 - 15.0 g/dL   HCT 40.329.8 (L) 47.436.0 - 25.946.0 %   MCV 93.4 80.0 - 100.0 fL   MCH 32.0 26.0 - 34.0 pg   MCHC 34.2 30.0 - 36.0 g/dL   RDW 56.313.9 87.511.5 - 64.315.5 %   Platelets 210 150 - 400 K/uL   nRBC 0.0 0.0 - 0.2 %    Comment: Performed at [facility], [address] [place], [place] [address]  Urinalysis, Routine w reflex microscopic     Status: Abnormal   Collection Time: 04/15/18  3:50 AM  Result Value Ref Range   Color, Urine COLORLESS (A) YELLOW   APPearance CLEAR CLEAR   Specific Gravity, Urine 1.003 (L) 1.005 - 1.030   pH 7.0 5.0 - 8.0   Glucose, UA NEGATIVE NEGATIVE mg/dL   Hgb urine dipstick NEGATIVE NEGATIVE   Bilirubin Urine NEGATIVE NEGATIVE   Ketones, ur NEGATIVE NEGATIVE mg/dL   Protein, ur NEGATIVE NEGATIVE mg/dL   Nitrite NEGATIVE NEGATIVE   Leukocytes, UA NEGATIVE NEGATIVE    Comment: Performed at [facility], [address] [place], [place] [address]  Protein / creatinine ratio, urine     Status: None   Collection Time: 04/15/18  3:50 AM  Result Value  Ref Range   Creatinine, Urine 18.00 mg/dL   Total Protein, Urine <6.00  mg/dL   Protein Creatinine Ratio        0.00 - 0.15 mg/mg[Cre]    Comment: RESULT BELOW REPORTABLE RANGE, UNABLE TO CALCULATE. Performed at [facility], [address] [place], [place] [address]   CBC with Differential/Platelet     Status: Abnormal   Collection Time: 04/15/18  4:07 AM  Result Value Ref Range   WBC 15.5 (H) 4.0 - 10.5 K/uL    Comment: REPEATED TO VERIFY   RBC 3.51 (L) 3.87 - 5.11 MIL/uL   Hemoglobin 11.1 (L) 12.0 - 15.0 g/dL   HCT 60.4 (L) 54.0 - 98.1 %   MCV 93.7 80.0 - 100.0 fL   MCH 31.6 26.0 - 34.0 pg   MCHC 33.7 30.0 - 36.0 g/dL   RDW 19.1 47.8 - 29.5 %   Platelets 268 150 - 400 K/uL   nRBC 0.0 0.0 - 0.2 %   Neutrophils Relative % 72 %   Neutro Abs 11.1 (H) 1.7 - 7.7 K/uL    Comment: CORRECTED ON 12/31 AT 0445: PREVIOUSLY REPORTED AS 11.2   Lymphocytes Relative 19 %   Lymphs Abs 2.9 0.7 - 4.0 K/uL    Comment: CORRECTED ON 12/31 AT 0445: PREVIOUSLY REPORTED AS 3.0   Monocytes Relative 5 %   Monocytes Absolute 0.8 0.1 - 1.0 K/uL   Eosinophils Relative 3 %   Eosinophils Absolute 0.5 0.0 - 0.5 K/uL    Comment: CORRECTED ON 12/31 AT 0445: PREVIOUSLY REPORTED AS 0.4   Basophils Relative 1 %   Basophils Absolute 0.2 (H) 0.0 - 0.1 K/uL    Comment: CORRECTED ON 12/31 AT 0445: PREVIOUSLY REPORTED AS 0.1   Other 0 %    Comment: Performed at [facility], [address], [place], [place] [address]  Comprehensive metabolic panel     Status: Abnormal   Collection Time: 04/15/18  4:07 AM  Result Value Ref Range   Sodium 139 135 - 145 mmol/L   Potassium 3.6 3.5 - 5.1 mmol/L   Chloride 109 98 - 111 mmol/L   CO2 24 22 - 32 mmol/L   Glucose, Bld 98 70 - 99 mg/dL   BUN 14 6 - 20 mg/dL   Creatinine, Ser 8.65 0.44 - 1.00 mg/dL   Calcium 8.6 (L) 8.9 - 10.3 mg/dL   Total Protein 5.7 (L) 6.5 - 8.1 g/dL   Albumin 3.0 (L) 3.5 - 5.0 g/dL   AST 14 (L) 15 - 41 U/L   ALT 20 0 - 44 U/L   Alkaline Phosphatase 55 38 - 126 U/L   Total Bilirubin 0.8 0.3 - 1.2 mg/dL    GFR calc non Af Amer >60 >60 mL/min   GFR calc Af Amer >60 >60 mL/min   Anion gap 6 5 - 15    Comment: Performed at [facility], [address] [place], [place] [address]    Assessment/Plan: Postpartum hypertension -most likely chronic hypertension -continue procardia XL 30 mg and labetalol 100 mg BID -encouraged to keep f/u with OB/Gyn.  Will be happy to manage bp after this. -discussed increasing po intake of water, increasing physical activity as tolerated, and other lifestyle modifications -given handout -continue checking bp at home  Encounter to establish care -We reviewed the PMH, PSH, FH, SH, Meds and Allergies. -We provided refills for any medications we will prescribe as needed. -We addressed current concerns per orders and patient instructions. -We have  asked for records for pertinent exams, studies, vaccines and notes from previous providers. -We have advised patient to follow up per instructions below.  F/u prn in 2-4 wks for bp  Abbe Amsterdam, MD

## 2018-05-09 NOTE — Patient Instructions (Signed)
Postpartum Hypertension  Postpartum hypertension is high blood pressure that remains higher than normal after childbirth. You may not realize that you have postpartum hypertension if your blood pressure is not being checked regularly. In most cases, postpartum hypertension will go away on its own, usually within a week of delivery. However, for some women, medical treatment is required to prevent serious complications, such as seizures or stroke.  What are the causes?  This condition may be caused by one or more of the following:   Hypertension that existed before pregnancy (chronic hypertension).   Hypertension that comes on as a result of pregnancy (gestational hypertension).   Hypertensive disorders during pregnancy (preeclampsia) or seizures in women who have high blood pressure during pregnancy (eclampsia).   A condition in which the liver, platelets, and red blood cells are damaged during pregnancy (HELLP syndrome).   A condition in which the thyroid produces too much hormones (hyperthyroidism).   Other rare problems of the nerves (neurological disorders) or blood disorders.  In some cases, the cause may not be known.  What increases the risk?  The following factors may make you more likely to develop this condition:   Chronic hypertension. In some cases, this may not have been diagnosed before pregnancy.   Obesity.   Type 2 diabetes.   Kidney disease.   History of preeclampsia or eclampsia.   Other medical conditions that change the level of hormones in the body (hormonal imbalance).  What are the signs or symptoms?  As with all types of hypertension, postpartum hypertension may not have any symptoms. Depending on how high your blood pressure is, you may experience:   Headaches. These may be mild, moderate, or severe. They may also be steady, constant, or sudden in onset (thunderclap headache).   Changes in your ability to see (visual changes).   Dizziness.   Shortness of breath.   Swelling  of your hands, feet, lower legs, or face. In some cases, you may have swelling in more than one of these locations.   Heart palpitations or a racing heartbeat.   Difficulty breathing while lying down.   Decrease in the amount of urine that you pass.  Other rare signs and symptoms may include:   Sweating more than usual. This lasts longer than a few days after delivery.   Chest pain.   Sudden dizziness when you get up from sitting or lying down.   Seizures.   Nausea or vomiting.   Abdominal pain.  How is this diagnosed?  This condition may be diagnosed based on the results of a physical exam, blood pressure measurements, and blood and urine tests.  You may also have other tests, such as a CT scan or an MRI, to check for other problems of postpartum hypertension.  How is this treated?  If blood pressure is high enough to require treatment, your options may include:   Medicines to reduce blood pressure (antihypertensives). Tell your health care provider if you are breastfeeding or if you plan to breastfeed. There are many antihypertensive medicines that are safe to take while breastfeeding.   Stopping medicines that may be causing hypertension.   Treating medical conditions that are causing hypertension.   Treating the complications of hypertension, such as seizures, stroke, or kidney problems.  Your health care provider will also continue to monitor your blood pressure closely until it is within a safe range for you.  Follow these instructions at home:   Take over-the-counter and prescription medicines only as   told by your health care provider.   Return to your normal activities as told by your health care provider. Ask your health care provider what activities are safe for you.   Do not use any products that contain nicotine or tobacco, such as cigarettes and e-cigarettes. If you need help quitting, ask your health care provider.   Keep all follow-up visits as told by your health care provider. This  is important.  Contact a health care provider if:   Your symptoms get worse.   You have new symptoms, such as:  ? A headache that does not get better.  ? Dizziness.  ? Visual changes.  Get help right away if:   You suddenly develop swelling in your hands, ankles, or face.   You have sudden, rapid weight gain.   You develop difficulty breathing, chest pain, racing heartbeat, or heart palpitations.   You develop severe pain in your abdomen.   You have any symptoms of a stroke. "BE FAST" is an easy way to remember the main warning signs of a stroke:  ? B - Balance. Signs are dizziness, sudden trouble walking, or loss of balance.  ? E - Eyes. Signs are trouble seeing or a sudden change in vision.  ? F - Face. Signs are sudden weakness or numbness of the face, or the face or eyelid drooping on one side.  ? A - Arms. Signs are weakness or numbness in an arm. This happens suddenly and usually on one side of the body.  ? S - Speech. Signs are sudden trouble speaking, slurred speech, or trouble understanding what people say.  ? T - Time. Time to call emergency services. Write down what time symptoms started.   You have other signs of a stroke, such as:  ? A sudden, severe headache with no known cause.  ? Nausea or vomiting.  ? Seizure.  These symptoms may represent a serious problem that is an emergency. Do not wait to see if the symptoms will go away. Get medical help right away. Call your local emergency services (911 in the [place] Do not drive yourself to the hospital.  Summary   Postpartum hypertension is high blood pressure that remains higher than normal after childbirth.   In most cases, postpartum hypertension will go away on its own, usually within a week of delivery.   For some women, medical treatment is required to prevent serious complications, such as seizures or stroke.  This information is not intended to replace advice given to you by your health care provider. Make sure you discuss any questions  you have with your health care provider.  Document Released: 12/04/2013 Document Revised: 01/21/2017 Document Reviewed: 01/21/2017  [employer] Interactive Patient Education  2019 [employer]

## 2018-05-26 DIAGNOSIS — Z30430 Encounter for insertion of intrauterine contraceptive device: Secondary | ICD-10-CM | POA: Diagnosis not present

## 2018-06-12 VITALS — BP 118/78 | HR 68 | Temp 97.8°F | Wt 192.0 lb

## 2018-06-12 DIAGNOSIS — Z131 Encounter for screening for diabetes mellitus: Secondary | ICD-10-CM | POA: Diagnosis not present

## 2018-06-12 DIAGNOSIS — Z13220 Encounter for screening for lipoid disorders: Secondary | ICD-10-CM

## 2018-06-12 DIAGNOSIS — Z0000 Encounter for general adult medical examination without abnormal findings: Secondary | ICD-10-CM

## 2018-06-12 NOTE — Progress Notes (Signed)
Subjective:     Melissa Oneal is a 33 y.o. female and is here for a comprehensive physical exam. The patient reports no problems. Pt endorses recent stress as her now 2 mo infant son was sick.  Pt's son was hospitalized x several days, but is back home and doing well.  Pt states her mood is good.  She does note some worry about pt going to daycare.  Pt notes she has weaned off labetalol and procardia for her postpartum HTN.  Pt states bp at home has been 120s/80s.  She notes a few episodes where the diastolic was 92 or 94.  Pt inquires if her home bp cuff reads higher, as her bp is always lower while in the office.  Pap-2019 Immunizations-influenza vaccine 2019, Tdap 2019  Social History   Socioeconomic History  . Marital status: Married    Spouse name: Not on file  . Number of children: Not on file  . Years of education: Not on file  . Highest education level: Not on file  Occupational History  . Not on file  Social Needs  . Financial resource strain: Not on file  . Food insecurity:    Worry: Not on file    Inability: Not on file  . Transportation needs:    Medical: Not on file    Non-medical: Not on file  Tobacco Use  . Smoking status: Never Smoker  . Smokeless tobacco: Never Used  Substance and Sexual Activity  . Alcohol use: Never    Frequency: Never  . Drug use: Never  . Sexual activity: Yes    Birth control/protection: None  Lifestyle  . Physical activity:    Days per week: Not on file    Minutes per session: Not on file  . Stress: Not on file  Relationships  . Social connections:    Talks on phone: Not on file    Gets together: Not on file    Attends religious service: Not on file    Active member of club or organization: Not on file    Attends meetings of clubs or organizations: Not on file    Relationship status: Not on file  . Intimate partner violence:    Fear of current or ex partner: Not on file    Emotionally abused: Not on file   Physically abused: Not on file    Forced sexual activity: Not on file  Other Topics Concern  . Not on file  Social History Narrative  . Not on file   Health Maintenance  Topic Date Due  . PAP SMEAR-Modifier  05/28/2021  . TETANUS/TDAP  01/31/2028  . INFLUENZA VACCINE  Completed  . HIV Screening  Completed    The following portions of the patient's history were reviewed and updated as appropriate: allergies, current medications, past family history, past medical history, past social history, past surgical history and problem list.  Review of Systems Pertinent items noted in HPI and remainder of comprehensive ROS otherwise negative.   Objective:    BP 118/78 (BP Location: Left Arm, Patient Position: Sitting, Cuff Size: Large)   Pulse 68   Temp 97.8 F (36.6 C)   Wt 192 lb (87.1 kg)   LMP 06/05/2018 (Exact Date)   SpO2 99%   Breastfeeding Yes   BMI 30.99 kg/m  General appearance: alert, cooperative, appears stated age and no distress Head: Normocephalic, without obvious abnormality, atraumatic Eyes: conjunctivae/corneas clear. PERRL, EOM's intact. Fundi benign. Ears: normal TM's and external  ear canals both ears Nose: Nares normal. Septum midline. Mucosa normal. No drainage or sinus tenderness. Throat: lips, mucosa, and tongue normal; teeth and gums normal Neck: no adenopathy, no carotid bruit, no JVD, supple, symmetrical, trachea midline and thyroid not enlarged, symmetric, no tenderness/mass/nodules Lungs: clear to auscultation bilaterally Heart: regular rate and rhythm, S1, S2 normal, no murmur, click, rub or gallop Abdomen: soft, non-tender; bowel sounds normal; no masses,  no organomegaly Extremities: extremities normal, atraumatic, no cyanosis or edema Skin: Skin color, texture, turgor normal. No rashes or lesions Lymph nodes: Cervical, supraclavicular, and axillary nodes normal. Neurologic: Alert and oriented X 3, normal strength and tone. Normal symmetric reflexes.  Normal coordination and gait    Assessment:    Healthy female exam.      Plan:     Anticipatory guidance given including wearing seatbelts, smoke detectors in the home, increasing physical activity, increasing p.o. intake of water and vegetables. -will obtain labs, pt is fasting -pap and immunizations up to date -given handout -continue to monitor bp at home.  Consider brining bp cuff in to check accuracy.  If needed will restart procardia for bp >140/90 -next CPE in 1 yr See After Visit Summary for Counseling Recommendations    F/u in the next few months for blood pressure.  Abbe Amsterdam, MD

## 2018-06-12 NOTE — Patient Instructions (Signed)
Preventive Care 18-39 Years, Female Preventive care refers to lifestyle choices and visits with your health care provider that can promote health and wellness. What does preventive care include?   A yearly physical exam. This is also called an annual well check.  Dental exams once or twice a year.  Routine eye exams. Ask your health care provider how often you should have your eyes checked.  Personal lifestyle choices, including: ? Daily care of your teeth and gums. ? Regular physical activity. ? Eating a healthy diet. ? Avoiding tobacco and drug use. ? Limiting alcohol use. ? Practicing safe sex. ? Taking vitamin and mineral supplements as recommended by your health care provider. What happens during an annual well check? The services and screenings done by your health care provider during your annual well check will depend on your age, overall health, lifestyle risk factors, and family history of disease. Counseling Your health care provider may ask you questions about your:  Alcohol use.  Tobacco use.  Drug use.  Emotional well-being.  Home and relationship well-being.  Sexual activity.  Eating habits.  Work and work environment.  Method of birth control.  Menstrual cycle.  Pregnancy history. Screening You may have the following tests or measurements:  Height, weight, and BMI.  Diabetes screening. This is done by checking your blood sugar (glucose) after you have not eaten for a while (fasting).  Blood pressure.  Lipid and cholesterol levels. These may be checked every 5 years starting at age 20.  Skin check.  Hepatitis C blood test.  Hepatitis B blood test.  Sexually transmitted disease (STD) testing.  BRCA-related cancer screening. This may be done if you have a family history of breast, ovarian, tubal, or peritoneal cancers.  Pelvic exam and Pap test. This may be done every 3 years starting at age 21. Starting at age 30, this may be done every 5  years if you have a Pap test in combination with an HPV test. Discuss your test results, treatment options, and if necessary, the need for more tests with your health care provider. Vaccines Your health care provider may recommend certain vaccines, such as:  Influenza vaccine. This is recommended every year.  Tetanus, diphtheria, and acellular pertussis (Tdap, Td) vaccine. You may need a Td booster every 10 years.  Varicella vaccine. You may need this if you have not been vaccinated.  HPV vaccine. If you are 26 or younger, you may need three doses over 6 months.  Measles, mumps, and rubella (MMR) vaccine. You may need at least one dose of MMR. You may also need a second dose.  Pneumococcal 13-valent conjugate (PCV13) vaccine. You may need this if you have certain conditions and were not previously vaccinated.  Pneumococcal polysaccharide (PPSV23) vaccine. You may need one or two doses if you smoke cigarettes or if you have certain conditions.  Meningococcal vaccine. One dose is recommended if you are age 19-21 years and a first-year college student living in a residence hall, or if you have one of several medical conditions. You may also need additional booster doses.  Hepatitis A vaccine. You may need this if you have certain conditions or if you travel or work in places where you may be exposed to hepatitis A.  Hepatitis B vaccine. You may need this if you have certain conditions or if you travel or work in places where you may be exposed to hepatitis B.  Haemophilus influenzae type b (Hib) vaccine. You may need this if you   have certain risk factors. Talk to your health care provider about which screenings and vaccines you need and how often you need them. This information is not intended to replace advice given to you by your health care provider. Make sure you discuss any questions you have with your health care provider. Document Released: 05/29/2001 Document Revised: 11/13/2016  Document Reviewed: 02/01/2015 Elsevier Interactive Patient Education  2019 [employer].

## 2018-06-23 DIAGNOSIS — Z30431 Encounter for routine checking of intrauterine contraceptive device: Secondary | ICD-10-CM | POA: Diagnosis not present

## 2018-06-26 VITALS — BP 112/78 | HR 62 | Temp 98.2°F | Wt 194.0 lb

## 2018-06-26 DIAGNOSIS — Z0130 Encounter for examination of blood pressure without abnormal findings: Secondary | ICD-10-CM | POA: Diagnosis not present

## 2018-11-10 DIAGNOSIS — B9789 Other viral agents as the cause of diseases classified elsewhere: Secondary | ICD-10-CM | POA: Diagnosis not present

## 2018-11-10 DIAGNOSIS — J069 Acute upper respiratory infection, unspecified: Secondary | ICD-10-CM | POA: Diagnosis not present

## 2018-11-10 DIAGNOSIS — Z20822 Contact with and (suspected) exposure to covid-19: Secondary | ICD-10-CM

## 2018-11-10 DIAGNOSIS — Z20828 Contact with and (suspected) exposure to other viral communicable diseases: Secondary | ICD-10-CM | POA: Diagnosis not present

## 2018-11-10 NOTE — Telephone Encounter (Signed)
Copied from [place] [id number]. Topic: General - Inquiry >> Nov 10, 2018 11:27 AM Richardo Priest, NT wrote: Reason for CRM: Patient is in need of a work note stating she is going to be out of work for possibly 7+ days due to getting tested for COVID. Call back is [phone].  Dr. Volanda Napoleon please advise if we can write patient out for 7 days.

## 2018-11-10 NOTE — Telephone Encounter (Signed)
Letter created per Dr. Volanda Napoleon. Contacted patient. No answer LVM . Message included number for patient to call to let someone know when she is outside.

## 2018-11-10 NOTE — Telephone Encounter (Signed)
Ok for note 

## 2019-06-15 DIAGNOSIS — Z13220 Encounter for screening for lipoid disorders: Secondary | ICD-10-CM

## 2019-06-15 DIAGNOSIS — Z0000 Encounter for general adult medical examination without abnormal findings: Secondary | ICD-10-CM

## 2019-06-15 DIAGNOSIS — T3695XA Adverse effect of unspecified systemic antibiotic, initial encounter: Secondary | ICD-10-CM

## 2019-06-15 DIAGNOSIS — Z131 Encounter for screening for diabetes mellitus: Secondary | ICD-10-CM | POA: Diagnosis not present

## 2019-06-15 DIAGNOSIS — L989 Disorder of the skin and subcutaneous tissue, unspecified: Secondary | ICD-10-CM | POA: Diagnosis not present

## 2019-06-15 DIAGNOSIS — B379 Candidiasis, unspecified: Secondary | ICD-10-CM

## 2019-06-15 NOTE — Patient Instructions (Signed)
Preventive Care 21-35 Years Old, Female Preventive care refers to visits with your health care provider and lifestyle choices that can promote health and wellness. This includes:  A yearly physical exam. This may also be called an annual well check.  Regular dental visits and eye exams.  Immunizations.  Screening for certain conditions.  Healthy lifestyle choices, such as eating a healthy diet, getting regular exercise, not using drugs or products that contain nicotine and tobacco, and limiting alcohol use. What can I expect for my preventive care visit? Physical exam Your health care provider will check your:  Height and weight. This may be used to calculate body mass index (BMI), which tells if you are at a healthy weight.  Heart rate and blood pressure.  Skin for abnormal spots. Counseling Your health care provider may ask you questions about your:  Alcohol, tobacco, and drug use.  Emotional well-being.  Home and relationship well-being.  Sexual activity.  Eating habits.  Work and work environment.  Method of birth control.  Menstrual cycle.  Pregnancy history. What immunizations do I need?  Influenza (flu) vaccine  This is recommended every year. Tetanus, diphtheria, and pertussis (Tdap) vaccine  You may need a Td booster every 10 years. Varicella (chickenpox) vaccine  You may need this if you have not been vaccinated. Human papillomavirus (HPV) vaccine  If recommended by your health care provider, you may need three doses over 6 months. Measles, mumps, and rubella (MMR) vaccine  You may need at least one dose of MMR. You may also need a second dose. Meningococcal conjugate (MenACWY) vaccine  One dose is recommended if you are age 19-21 years and a first-year college student living in a residence hall, or if you have one of several medical conditions. You may also need additional booster doses. Pneumococcal conjugate (PCV13) vaccine  You may need  this if you have certain conditions and were not previously vaccinated. Pneumococcal polysaccharide (PPSV23) vaccine  You may need one or two doses if you smoke cigarettes or if you have certain conditions. Hepatitis A vaccine  You may need this if you have certain conditions or if you travel or work in places where you may be exposed to hepatitis A. Hepatitis B vaccine  You may need this if you have certain conditions or if you travel or work in places where you may be exposed to hepatitis B. Haemophilus influenzae type b (Hib) vaccine  You may need this if you have certain conditions. You may receive vaccines as individual doses or as more than one vaccine together in one shot (combination vaccines). Talk with your health care provider about the risks and benefits of combination vaccines. What tests do I need?  Blood tests  Lipid and cholesterol levels. These may be checked every 5 years starting at age 20.  Hepatitis C test.  Hepatitis B test. Screening  Diabetes screening. This is done by checking your blood sugar (glucose) after you have not eaten for a while (fasting).  Sexually transmitted disease (STD) testing.  BRCA-related cancer screening. This may be done if you have a family history of breast, ovarian, tubal, or peritoneal cancers.  Pelvic exam and Pap test. This may be done every 3 years starting at age 21. Starting at age 30, this may be done every 5 years if you have a Pap test in combination with an HPV test. Talk with your health care provider about your test results, treatment options, and if necessary, the need for more tests.   Follow these instructions at home: Eating and drinking   Eat a diet that includes fresh fruits and vegetables, whole grains, lean protein, and low-fat dairy.  Take vitamin and mineral supplements as recommended by your health care provider.  Do not drink alcohol if: ? Your health care provider tells you not to drink. ? You are  pregnant, may be pregnant, or are planning to become pregnant.  If you drink alcohol: ? Limit how much you have to 0-1 drink a day. ? Be aware of how much alcohol is in your drink. In the [place] one drink equals one 12 oz bottle of beer (355 mL), one 5 oz glass of wine (148 mL), or one 1 oz glass of hard liquor (44 mL). Lifestyle  Take daily care of your teeth and gums.  Stay active. Exercise for at least 30 minutes on 5 or more days each week.  Do not use any products that contain nicotine or tobacco, such as cigarettes, e-cigarettes, and chewing tobacco. If you need help quitting, ask your health care provider.  If you are sexually active, practice safe sex. Use a condom or other form of birth control (contraception) in order to prevent pregnancy and STIs (sexually transmitted infections). If you plan to become pregnant, see your health care provider for a preconception visit. What's next?  Visit your health care provider once a year for a well check visit.  Ask your health care provider how often you should have your eyes and teeth checked.  Stay up to date on all vaccines. This information is not intended to replace advice given to you by your health care provider. Make sure you discuss any questions you have with your health care provider. Document Revised: 12/12/2017 Document Reviewed: 12/12/2017 Elsevier Patient Education  [place] Disease Caregiver Guide  Alzheimer disease is a brain disease that causes memory loss and changes in behavior. People with Alzheimer disease often have problems paying attention, communicating, and doing routine tasks. The disease gets worse over time, and people with the disease eventually need full-time care. Taking care of someone with Alzheimer disease can be challenging and overwhelming. This guide provides helpful information and tips that can make caring for someone with the disease a little easier. What changes does  Alzheimer disease cause? Alzheimer disease causes a person to lose the ability to remember things and make decisions. Memory loss and confusion are usually mild at the start of the disease, but they get more severe over time. Eventually, the person may not recognize friends, family members, or familiar places. Alzheimer disease can also cause hallucinations, changes in behavior, and changes in mood, such as anxiety or anger. The changes can come on suddenly. They may happen in response to something such as:  Pain.  An infection.  Changes in environment, such as changes in temperature or noise.  Overstimulation.  Feeling lost or scared. Tips for managing symptoms  Be calm and patient.  Give short, simple answers to questions. Long answers can overwhelm and confuse the person.  Avoid correcting the person in a negative way.  Try not to take things personally, even if the person forgets your name. Understand that changes are a part of the disease process.  Do not argue or try to convince the person about a specific point. Doing that may make the person feel more agitated. Tips for reducing frustration  Make appointments and do daily tasks, like bathing and dressing, when the person is at his or  her best.  Allow for plenty of time for simple tasks because they may take longer than expected. Take your time when doing these tasks.  Limit the person's choices. Too many choices can be overwhelming and stressful for the person.  Involve the person in what you are doing.  Keep a daily routine.  Avoid crowds and new situations, if possible.  Use simple words, short sentences, and a calm voice. Only give one direction at a time.  Buy clothes and shoes that are easy to put on and take off.  Organize medications in a pillbox for each day of the week.  Keep a calendar in a central location to remind the person of appointments or other activities.  Ask about respite care resources so  that you can have a regular break from the stress of caregiving. Tips for reducing the risk of injury  Keep floors clear of clutter. Remove rugs, magazine racks, and floor lamps.  Keep hallways well-lit, especially at night.  Put a handrail   Put locks on doors. Put the locks in places where the person cannot see or reach them easily. This will help ensure that the person does not wander out of the house and get lost.  Be prepared for emergencies. Keep a list of emergency phone numbers and addresses in a convenient area.  Remove car keys and lock garage doors so that the person does not try to get in the car and drive.  A certain type of bracelet may be worn that tracks a person's location and identifies him or her as having memory problems. This should be worn at all times for safety. Tips for future planning  Discuss financial and legal planning early on in the course of the disease. People with Alzheimer disease will have trouble managing their money as the disease gets worse. Get help from professional advisers regarding financial and legal matters.  Discuss advance directives, safety, and daily care. Take these steps: ? Create a living will and choose a power of attorney. The person with power of attorney will be able to make decisions for the person with Alzheimer disease when he or she is no longer able to. ? Discuss driving safety and when to stop driving. The person's health care provider can help provide assistance with this decision. ? Discuss the person's living situation. If the person lives alone, make sure he or she is safe. People who live at home may need extra help from home health caregivers, and those who live in a nursing home or care center may need more care. Where to find support One way to find support is to join a local support group. Advantages of being part of a support group include:  Learning strategies to manage stress.  Sharing experiences with  others.  Receiving emotional comfort and support.  Learning about caregiving as the disease progresses.  Knowing what community resources are available and making use of them. Where to find more information  Alzheimer's Association: [employer] Contact a health care provider if:  The person has a fever.  The person has a sudden change in behavior that does not improve with calming strategies.  The person is unable to manage in his or her current living situation.  The person threatens himself or herself, you, or anyone else.  You are no longer able to care for the person. Summary  Alzheimer disease is a brain disease that causes memory loss and changes in behavior.  People with Alzheimer disease often  have problems paying attention, communicating, and doing routine tasks. The disease gets worse over time, and people with the disease eventually need full-time care.  Take steps to reduce the person's risk of injury, and plan for future care.  Taking care of someone with Alzheimer disease can be very challenging and overwhelming. One way to find support during this time is to join a local support group. This information is not intended to replace advice given to you by your health care provider. Make sure you discuss any questions you have with your health care provider. Document Revised: 07/22/2018 Document Reviewed: 05/04/2016 Elsevier Patient Education  [employer].

## 2019-12-07 DIAGNOSIS — R41840 Attention and concentration deficit: Secondary | ICD-10-CM | POA: Diagnosis not present

## 2019-12-07 NOTE — Progress Notes (Signed)
Virtual Visit via Video Note  I connected with Melissa Oneal on 12/07/19 at  3:00 PM EDT by a video enabled telemedicine application 2/2 [id number]-[id number] pandemic and verified that I am speaking with the correct person using two identifiers.  Location patient: home Location provider:work or home office Persons participating in the virtual visit: patient, provider  I discussed the limitations of evaluation and management by telemedicine and the availability of in person appointments. The patient expressed understanding and agreed to proceed.   HPI:  Pt feels like she is "losing her mind a little bit".  Pt feels scattered.  Increased concern 2/2 her mother's h/o early onset dementia.  Pt can't remember little things like where she put her phone.  Pt having trouble staying organized.  States her attention spans is not as good/ she distracted.  Pt never had issues with being distracted.  Pt notes having more on her plate than she typically does.  Work has become more demanding.  She has started school and is also caring for her son.  Pt's son is in school/daycare 4 days per wk. Pt states sleep has been better, might be getting 6 hrs per night the last wk.  Was exercising, but hasn't had time lately.  Pt notes finding enjoyment in [place] she used to do. Pt looking into start counseling.    High school was easy for pt.  Pt notes in college things were a little more difficult.  At one point was on ADHD meds.  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   ROS: See pertinent positives and negatives per HPI.  Past Medical History:  Diagnosis Date  . Anxiety   . Hypertension     Past Surgical History:  Procedure Laterality Date  . CHOLECYSTECTOMY    . TONSILLECTOMY      Family History  Problem Relation Age of Onset  . Miscarriages / [place] Mother   . Dementia Mother   . Cancer Paternal Aunt   . Hypertension Maternal Grandfather   . Diabetes Maternal Grandfather   . Cancer Paternal Grandmother   .  Heart disease Paternal Grandmother   . Hypertension Paternal Grandmother   . Cancer Paternal Grandfather   . Diabetes Paternal Grandfather   . Dementia Maternal Grandmother      Current Outpatient Medications:  .  Prenatal Vit-Fe Fumarate-FA (PRENATAL MULTIVITAMIN) TABS tablet, Take 1 tablet by mouth daily at 12 noon., Disp: , Rfl:   EXAM:  VITALS per patient if applicable: RR between 29-52 bpm  GENERAL: alert, oriented, appears well and in no acute distress  HEENT: atraumatic, conjunctiva clear, no obvious abnormalities on inspection of external nose and ears  NECK: normal movements of the head and neck  LUNGS: on inspection no signs of respiratory distress, breathing rate appears normal, no obvious gross SOB, gasping or wheezing  CV: no obvious cyanosis  MS: moves all visible extremities without noticeable abnormality  PSYCH/NEURO: pleasant and cooperative, no obvious depression or anxiety, speech and thought processing grossly intact  ASSESSMENT AND PLAN:  Discussed the following assessment and plan:  Concentration deficit  -previous h/o ADHD in college -PHQ2-9 screen score 0 -discussed obtaining labs to check for reversible causes -if labs negative evaluate for current adhd symptoms - Plan: TSH, T4, free, CMP with eGFR([facility]), Vitamin D, 25-hydroxy, Vitamin B12, CBC (no diff)  F/u in the next few wks prn   I discussed the assessment and treatment plan with the patient. The patient was provided an opportunity to ask  questions and all were answered. The patient agreed with the plan and demonstrated an understanding of the instructions.   The patient was advised to call back or seek an in-person evaluation if the symptoms worsen or if the condition fails to improve as anticipated.  Billie Ruddy, MD

## 2019-12-10 DIAGNOSIS — R41840 Attention and concentration deficit: Secondary | ICD-10-CM

## 2020-02-19 DIAGNOSIS — H6981 Other specified disorders of Eustachian tube, right ear: Secondary | ICD-10-CM | POA: Diagnosis not present

## 2020-02-19 DIAGNOSIS — M26609 Unspecified temporomandibular joint disorder, unspecified side: Secondary | ICD-10-CM

## 2020-02-19 DIAGNOSIS — H6690 Otitis media, unspecified, unspecified ear: Secondary | ICD-10-CM | POA: Diagnosis not present

## 2020-02-19 DIAGNOSIS — M5441 Lumbago with sciatica, right side: Secondary | ICD-10-CM | POA: Diagnosis not present

## 2020-02-19 NOTE — Patient Instructions (Addendum)
Temporomandibular Joint Syndrome  Temporomandibular joint syndrome (TMJ syndrome) is a condition that causes pain in the temporomandibular joints. These joints are located near your ears and allow your jaw to open and close. For people with TMJ syndrome, chewing, biting, or other movements of the jaw can be difficult or painful. TMJ syndrome is often mild and goes away within a few weeks. However, sometimes the condition becomes a long-term (chronic) problem. What are the causes? This condition may be caused by:  Grinding your teeth or clenching your jaw. Some people do this when they are under stress.  Arthritis.  Injury to the jaw.  Head or neck injury.  Teeth or dentures that are not aligned well. In some cases, the cause of TMJ syndrome may not be known. What are the signs or symptoms? The most common symptom of this condition is an aching pain on the side of the head in the area of the TMJ. Other symptoms may include:  Pain when moving your jaw, such as when chewing or biting.  Being unable to open your jaw all the way.  Making a clicking sound when you open your mouth.  Headache.  Earache.  Neck or shoulder pain. How is this diagnosed? This condition may be diagnosed based on:  Your symptoms and medical history.  A physical exam. Your health care provider may check the range of motion of your jaw.  Imaging tests, such as X-rays or an MRI. You may also need to see your dentist, who will determine if your teeth and jaw are lined up correctly. How is this treated? TMJ syndrome often goes away on its own. If treatment is needed, the options may include:  Eating soft foods and applying ice or heat.  Medicines to relieve pain or inflammation.  Medicines or massage to relax the muscles.  A splint, bite plate, or mouthpiece to prevent teeth grinding or jaw clenching.  Relaxation techniques or counseling to help reduce stress.  A therapy for pain in which an  electrical current is applied to the nerves through the skin (transcutaneous electrical nerve stimulation).  Acupuncture. This is sometimes helpful to relieve pain.  Jaw surgery. This is rarely needed. Follow these instructions at home:  Eating and drinking  Eat a soft diet if you are having trouble chewing.  Avoid foods that require a lot of chewing. Do not chew gum. General instructions  Take over-the-counter and prescription medicines only as told by your health care provider.  If directed, put ice on the painful area. ? Put ice in a plastic bag. ? Place a towel between your skin and the bag. ? Leave the ice on for 20 minutes, 2-3 times a day.  Apply a warm, wet cloth (warm compress) to the painful area as directed.  Massage your jaw area and do any jaw stretching exercises as told by your health care provider.  If you were given a splint, bite plate, or mouthpiece, wear it as told by your health care provider.  Keep all follow-up visits as told by your health care provider. This is important. Contact a health care provider if:  You are having trouble eating.  You have new or worsening symptoms. Get help right away if:  Your jaw locks open or closed. Summary  Temporomandibular joint syndrome (TMJ syndrome) is a condition that causes pain in the temporomandibular joints. These joints are located near your ears and allow your jaw to open and close.  TMJ syndrome is often mild and   goes away within a few weeks. However, sometimes the condition becomes a long-term (chronic) problem.  Symptoms include an aching pain on the side of the head in the area of the TMJ, pain when chewing or biting, and being unable to open your jaw all the way. You may also make a clicking sound when you open your mouth.  TMJ syndrome often goes away on its own. If treatment is needed, it may include medicines to relieve pain, reduce inflammation, or relax the muscles. A splint, bite plate, or  mouthpiece may also be used to prevent teeth grinding or jaw clenching. This information is not intended to replace advice given to you by your health care provider. Make sure you discuss any questions you have with your health care provider. Document Revised: 06/14/2017 Document Reviewed: 05/14/2017 Elsevier Patient Education  2020 [employer]  Otitis Media, Adult  Otitis media means that the middle ear is red and swollen (inflamed) and full of fluid. The condition usually goes away on its own. Follow these instructions at home:  Take over-the-counter and prescription medicines only as told by your doctor.  If you were prescribed an antibiotic medicine, take it as told by your doctor. Do not stop taking the antibiotic even if you start to feel better.  Keep all follow-up visits as told by your doctor. This is important. Contact a doctor if:  You have bleeding from your nose.  There is a lump on your neck.  You are not getting better in 5 days.  You feel worse instead of better. Get help right away if:  You have pain that is not helped with medicine.  You have swelling, redness, or pain around your ear.  You get a stiff neck.  You cannot move part of your face (paralyzed).  You notice that the bone behind your ear hurts when you touch it.  You get a very bad headache. Summary  Otitis media means that the middle ear is red, swollen, and full of fluid.  This condition usually goes away on its own. In some cases, treatment may be needed.  If you were prescribed an antibiotic medicine, take it as told by your doctor. This information is not intended to replace advice given to you by your health care provider. Make sure you discuss any questions you have with your health care provider. Document Revised: 03/15/2017 Document Reviewed: 04/23/2016 Elsevier Patient Education  2020 [employer]  Eustachian Tube Dysfunction  Eustachian tube dysfunction refers to a condition  in which a blockage develops in the narrow passage that connects the middle ear to the back of the nose (eustachian tube). The eustachian tube regulates air pressure in the middle ear by letting air move between the ear and nose. It also helps to drain fluid from the middle ear space. Eustachian tube dysfunction can affect one or both ears. When the eustachian tube does not function properly, air pressure, fluid, or both can build up in the middle ear. What are the causes? This condition occurs when the eustachian tube becomes blocked or cannot open normally. Common causes of this condition include:  Ear infections.  Colds and other infections that affect the nose, mouth, and throat (upper respiratory tract).  Allergies.  Irritation from cigarette smoke.  Irritation from stomach acid coming up into the esophagus (gastroesophageal reflux). The esophagus is the tube that carries food from the mouth to the stomach.  Sudden changes in air pressure, such as from descending in an airplane or  scuba diving.  Abnormal growths in the nose or throat, such as: ? Growths that line the nose (nasal polyps). ? Abnormal growth of cells (tumors). ? Enlarged tissue at the back of the throat (adenoids). What increases the risk? You are more likely to develop this condition if:  You smoke.  You are overweight.  You are a child who has: ? Certain birth defects of the mouth, such as cleft palate. ? Large tonsils or adenoids. What are the signs or symptoms? Common symptoms of this condition include:  A feeling of fullness in the ear.  Ear pain.  Clicking or popping noises in the ear.  Ringing in the ear.  Hearing loss.  Loss of balance.  Dizziness. Symptoms may get worse when the air pressure around you changes, such as when you travel to an area of high elevation, fly on an airplane, or go scuba diving. How is this diagnosed? This condition may be diagnosed based on:  Your symptoms.  A  physical exam of your ears, nose, and throat.  Tests, such as those that measure: ? The movement of your eardrum (tympanogram). ? Your hearing (audiometry). How is this treated? Treatment depends on the cause and severity of your condition.  In mild cases, you may relieve your symptoms by moving air into your ears. This is called "popping the ears."  In more severe cases, or if you have symptoms of fluid in your ears, treatment may include: ? Medicines to relieve congestion (decongestants). ? Medicines that treat allergies (antihistamines). ? Nasal sprays or ear drops that contain medicines that reduce swelling (steroids). ? A procedure to drain the fluid in your eardrum (myringotomy). In this procedure, a small tube is placed in the eardrum to:  Drain the fluid.  Restore the air in the middle ear space. ? A procedure to insert a balloon device through the nose to inflate the opening of the eustachian tube (balloon dilation). Follow these instructions at home: Lifestyle  Do not do any of the following until your health care provider approves: ? Travel to high altitudes. ? Fly in airplanes. ? Work in a Estate agent or room. ? Scuba dive.  Do not use any products that contain nicotine or tobacco, such as cigarettes and e-cigarettes. If you need help quitting, ask your health care provider.  Keep your ears dry. Wear fitted earplugs during showering and bathing. Dry your ears completely after. General instructions  Take over-the-counter and prescription medicines only as told by your health care provider.  Use techniques to help pop your ears as recommended by your health care provider. These may include: ? Chewing gum. ? Yawning. ? Frequent, forceful swallowing. ? Closing your mouth, holding your nose closed, and gently blowing as if you are trying to blow air out of your nose.  Keep all follow-up visits as told by your health care provider. This is important. Contact a  health care provider if:  Your symptoms do not go away after treatment.  Your symptoms come back after treatment.  You are unable to pop your ears.  You have: ? A fever. ? Pain in your ear. ? Pain in your head or neck. ? Fluid draining from your ear.  Your hearing suddenly changes.  You become very dizzy.  You lose your balance. Summary  Eustachian tube dysfunction refers to a condition in which a blockage develops in the eustachian tube.  It can be caused by ear infections, allergies, inhaled irritants, or abnormal growths in the  nose or throat.  Symptoms include ear pain, hearing loss, or ringing in the ears.  Mild cases are treated with maneuvers to unblock the ears, such as yawning or ear popping.  Severe cases are treated with medicines. Surgery may also be done (rare). This information is not intended to replace advice given to you by your health care provider. Make sure you discuss any questions you have with your health care provider. Document Revised: 07/23/2017 Document Reviewed: 07/23/2017 Elsevier Patient Education  2020 [employer] Inc[employer]  Sciatica  Sciatica is pain, numbness, weakness, or tingling along the path of the sciatic nerve. The sciatic nerve starts in the lower back and runs down the back of each leg. The nerve controls the muscles in the lower leg and in the back of the knee. It also provides feeling (sensation) to the back of the thigh, the lower leg, and the sole of the foot. Sciatica is a symptom of another medical condition that pinches or puts pressure on the sciatic nerve. Sciatica most often only affects one side of the body. Sciatica usually goes away on its own or with treatment. In some cases, sciatica may come back (recur). What are the causes? This condition is caused by pressure on the sciatic nerve or pinching of the nerve. This may be the result of:  A disk in between the bones of the spine bulging out too far (herniated  disk).  Age-related changes in the spinal disks.  A pain disorder that affects a muscle in the buttock.  Extra bone growth near the sciatic nerve.  A break (fracture) of the pelvis.  Pregnancy.  Tumor. This is rare. What increases the risk? The following factors may make you more likely to develop this condition:  Playing sports that place pressure or stress on the spine.  Having poor strength and flexibility.  A history of back injury or surgery.  Sitting for long periods of time.  Doing activities that involve repetitive bending or lifting.  Obesity. What are the signs or symptoms? Symptoms can vary from mild to very severe, and they may include:  Any of these problems in the lower back, leg, hip, or buttock: ? Mild tingling, numbness, or dull aches. ? Burning sensations. ? Sharp pains.  Numbness in the back of the calf or the sole of the foot.  Leg weakness.  Severe back pain that makes movement difficult. Symptoms may get worse when you cough, sneeze, or laugh, or when you sit or stand for long periods of time. How is this diagnosed? This condition may be diagnosed based on:  Your symptoms and medical history.  A physical exam.  Blood tests.  Imaging tests, such as: ? X-rays. ? MRI. ? CT scan. How is this treated? In many cases, this condition improves on its own without treatment. However, treatment may include:  Reducing or modifying physical activity.  Exercising and stretching.  Icing and applying heat to the affected area.  Medicines that help to: ? Relieve pain and swelling. ? Relax your muscles.  Injections of medicines that help to relieve pain, irritation, and inflammation around the sciatic nerve (steroids).  Surgery. Follow these instructions at home: Medicines  Take over-the-counter and prescription medicines only as told by your health care provider.  Ask your health care provider if the medicine prescribed to  you: ? Requires you to avoid driving or using heavy machinery. ? Can cause constipation. You may need to take these actions to prevent or treat constipation:  Drink  enough fluid to keep your urine pale yellow.  Take over-the-counter or prescription medicines.  Eat foods that are high in fiber, such as beans, whole grains, and fresh fruits and vegetables.  Limit foods that are high in fat and processed sugars, such as fried or sweet foods. Managing pain      If directed, put ice on the affected area. ? Put ice in a plastic bag. ? Place a towel between your skin and the bag. ? Leave the ice on for 20 minutes, 2-3 times a day.  If directed, apply heat to the affected area. Use the heat source that your health care provider recommends, such as a moist heat pack or a heating pad. ? Place a towel between your skin and the heat source. ? Leave the heat on for 20-30 minutes. ? Remove the heat if your skin turns bright red. This is especially important if you are unable to feel pain, heat, or cold. You may have a greater risk of getting burned. Activity   Return to your normal activities as told by your health care provider. Ask your health care provider what activities are safe for you.  Avoid activities that make your symptoms worse.  Take brief periods of rest throughout the day. ? When you rest for longer periods, mix in some mild activity or stretching between periods of rest. This will help to prevent stiffness and pain. ? Avoid sitting for long periods of time without moving. Get up and move around at least one time each hour.  Exercise and stretch regularly, as told by your health care provider.  Do not lift anything that is heavier than 10 lb (4.5 kg) while you have symptoms of sciatica. When you do not have symptoms, you should still avoid heavy lifting, especially repetitive heavy lifting.  When you lift objects, always use proper lifting technique, which  includes: ? Bending your knees. ? Keeping the load close to your body. ? Avoiding twisting. General instructions  Maintain a healthy weight. Excess weight puts extra stress on your back.  Wear supportive, comfortable shoes. Avoid wearing high heels.  Avoid sleeping on a mattress that is too soft or too hard. A mattress that is firm enough to support your back when you sleep may help to reduce your pain.  Keep all follow-up visits as told by your health care provider. This is important. Contact a health care provider if:  You have pain that: ? Wakes you up when you are sleeping. ? Gets worse when you lie down. ? Is worse than you have experienced in the past. ? Lasts longer than 4 weeks.  You have an unexplained weight loss. Get help right away if:  You are not able to control when you urinate or have bowel movements (incontinence).  You have: ? Weakness in your lower back, pelvis, buttocks, or legs that gets worse. ? Redness or swelling of your back. ? A burning sensation when you urinate. Summary  Sciatica is pain, numbness, weakness, or tingling along the path of the sciatic nerve.  This condition is caused by pressure on the sciatic nerve or pinching of the nerve.  Sciatica can cause pain, numbness, or tingling in the lower back, legs, hips, and buttocks.  Treatment often includes rest, exercise, medicines, and applying ice or heat. This information is not intended to replace advice given to you by your health care provider. Make sure you discuss any questions you have with your health care provider. Document Revised:  04/21/2018 Document Reviewed: 04/21/2018 Elsevier Patient Education  2020 [employer].  Sciatica Rehab Ask your health care provider which exercises are safe for you. Do exercises exactly as told by your health care provider and adjust them as directed. It is normal to feel mild stretching, pulling, tightness, or discomfort as you do these exercises.  Stop right away if you feel sudden pain or your pain gets worse. Do not begin these exercises until told by your health care provider. Stretching and range-of-motion exercises These exercises warm up your muscles and joints and improve the movement and flexibility of your hips and back. These exercises also help to relieve pain, numbness, and tingling. Sciatic nerve glide 1. Sit in a chair with your head facing down toward your chest. Place your hands behind your back. Let your shoulders slump forward. 2. Slowly straighten one of your legs while you tilt your head back as if you are looking toward the ceiling. Only straighten your leg as far as you can without making your symptoms worse. 3. Hold this position for __________ seconds. 4. Slowly return to the starting position. 5. Repeat with your other leg. Repeat __________ times. Complete this exercise __________ times a day. Knee to chest with hip adduction and internal rotation  1. Lie on your back on a firm surface with both legs straight. 2. Bend one of your knees and move it up toward your chest until you feel a gentle stretch in your lower back and buttock. Then, move your knee toward the shoulder that is on the opposite side from your leg. This is hip adduction and internal rotation. ? Hold your leg in this position by holding on to the front of your knee. 3. Hold this position for __________ seconds. 4. Slowly return to the starting position. 5. Repeat with your other leg. Repeat __________ times. Complete this exercise __________ times a day. Prone extension on elbows  1. Lie on your abdomen on a firm surface. A bed may be too soft for this exercise. 2. Prop yourself up on your elbows. 3. Use your arms to help lift your chest up until you feel a gentle stretch in your abdomen and your lower back. ? This will place some of your body weight on your elbows. If this is uncomfortable, try stacking pillows under your chest. ? Your hips  should stay down, against the surface that you are lying on. Keep your hip and back muscles relaxed. 4. Hold this position for __________ seconds. 5. Slowly relax your upper body and return to the starting position. Repeat __________ times. Complete this exercise __________ times a day. Strengthening exercises These exercises build strength and endurance in your back. Endurance is the ability to use your muscles for a long time, even after they get tired. Pelvic tilt This exercise strengthens the muscles that lie deep in the abdomen. 1. Lie on your back on a firm surface. Bend your knees and keep your feet flat on the floor. 2. Tense your abdominal muscles. Tip your pelvis up toward the ceiling and flatten your lower back into the floor. ? To help with this exercise, you may place a small towel under your lower back and try to push your back into the towel. 3. Hold this position for __________ seconds. 4. Let your muscles relax completely before you repeat this exercise. Repeat __________ times. Complete this exercise __________ times a day. Alternating arm and leg raises  1. Get on your hands and knees on a firm  surface. If you are on a hard floor, you may want to use padding, such as an exercise mat, to cushion your knees. 2. Line up your arms and legs. Your hands should be directly below your shoulders, and your knees should be directly below your hips. 3. Lift your left leg behind you. At the same time, raise your right arm and straighten it in front of you. ? Do not lift your leg higher than your hip. ? Do not lift your arm higher than your shoulder. ? Keep your abdominal and back muscles tight. ? Keep your hips facing the ground. ? Do not arch your back. ? Keep your balance carefully, and do not hold your breath. 4. Hold this position for __________ seconds. 5. Slowly return to the starting position. 6. Repeat with your right leg and your left arm. Repeat __________ times. Complete  this exercise __________ times a day. Posture and body mechanics Good posture and healthy body mechanics can help to relieve stress in your body's tissues and joints. Body mechanics refers to the movements and positions of your body while you do your daily activities. Posture is part of body mechanics. Good posture means:  Your spine is in its natural S-curve position (neutral).  Your shoulders are pulled back slightly.  Your head is not tipped forward. Follow these guidelines to improve your posture and body mechanics in your everyday activities. Standing   When standing, keep your spine neutral and your feet about hip width apart. Keep a slight bend in your knees. Your ears, shoulders, and hips should line up.  When you do a task in which you stand in one place for a long time, place one foot up on a stable object that is 2-4 inches (5-10 cm) high, such as a footstool. This helps keep your spine neutral. Sitting   When sitting, keep your spine neutral and keep your feet flat on the floor. Use a footrest, if necessary, and keep your thighs parallel to the floor. Avoid rounding your shoulders, and avoid tilting your head forward.  When working at a desk or a computer, keep your desk at a height where your hands are slightly lower than your elbows. Slide your chair under your desk so you are close enough to maintain good posture.  When working at a computer, place your monitor at a height where you are looking straight ahead and you do not have to tilt your head forward or downward to look at the screen. Resting  When lying down and resting, avoid positions that are most painful for you.  If you have pain with activities such as sitting, bending, stooping, or squatting, lie in a position in which your body does not bend very much. For example, avoid curling up on your side with your arms and knees near your chest (fetal position).  If you have pain with activities such as standing for a  long time or reaching with your arms, lie with your spine in a neutral position and bend your knees slightly. Try the following positions: ? Lying on your side with a pillow between your knees. ? Lying on your back with a pillow under your knees. Lifting   When lifting objects, keep your feet at least shoulder width apart and tighten your abdominal muscles.  Bend your knees and hips and keep your spine neutral. It is important to lift using the strength of your legs, not your back. Do not lock your knees straight out.  Always  ask for help to lift heavy or awkward objects. This information is not intended to replace advice given to you by your health care provider. Make sure you discuss any questions you have with your health care provider. Document Revised: 07/25/2018 Document Reviewed: 04/24/2018 Elsevier Patient Education  2020 [employer].

## 2020-02-19 NOTE — Progress Notes (Signed)
Subjective:    Patient ID: Melissa Oneal, female    DOB: July 31, 1985, 34 y.o.   MRN: [id number]  No chief complaint on file.   HPI Patient was seen today for acute concern.  Patient endorses right ear pain starting last night.  Pt took an NASID last night.  Still having pain in front of ear this am.  Pt also notes R sided low back pain that started a few days ago.  Pt denies injury, heavy lifting, pushing or pulling.  Pain will move down back of R thigh.     No longer breast feeding. Past Medical History:  Diagnosis Date  . Anxiety   . Hypertension     Allergies  Allergen Reactions  . Cephalosporins Rash    ROS General: Denies fever, chills, night sweats, changes in weight, changes in appetite HEENT: Denies headaches, changes in vision, rhinorrhea, sore throat  + right ear pain CV: Denies CP, palpitations, SOB, orthopnea Pulm: Denies SOB, cough, wheezing GI: Denies abdominal pain, nausea, vomiting, diarrhea, constipation GU: Denies dysuria, hematuria, frequency, vaginal discharge Msk: Denies muscle cramps, joint pains + low back pain Neuro: Denies weakness, numbness, tingling Skin: Denies rashes, bruising Psych: Denies depression, anxiety, hallucinations     Objective:    Blood pressure 118/80, pulse 82, temperature 98.7 F (37.1 C), temperature source Oral, weight 228 lb (103.4 kg), SpO2 99 %.   Gen. Pleasant, well-nourished, in no distress, normal affect  HEENT: [place]/AT, face symmetric, conjunctiva clear, no scleral icterus, PERRLA, EOMI, nares patent without drainage, pharynx without erythema or exudate.  R TM full with mild eyrthema.  L TM full. Neck: No JVD, no thyromegaly, no carotid bruits Lungs: no accessory muscle use Cardiovascular: RRR, no m/r/g, no peripheral edema Musculoskeletal: No TTP of cervical, thoracic spine.  TTP midline lumbar spine and right lumbar paraspinal muscles.  No LE weakness.  No deformities, no cyanosis or clubbing, normal  tone Neuro:  A&Ox3, CN II-XII intact, normal gait Skin:  Warm, no lesions/ rash   Wt Readings from Last 3 Encounters:  12/07/19 220 lb (99.8 kg)  06/15/19 214 lb (97.1 kg)  06/26/18 194 lb (88 kg)    Lab Results  Component Value Date   WBC 9.9 12/10/2019   HGB 13.7 12/10/2019   HCT 40.6 12/10/2019   PLT 332 12/10/2019   GLUCOSE 85 12/10/2019   CHOL 146 06/15/2019   TRIG 57.0 06/15/2019   HDL 42.50 06/15/2019   LDLCALC 92 06/15/2019   ALT 22 12/10/2019   AST 15 12/10/2019   NA 141 12/10/2019   K 4.4 12/10/2019   CL 107 12/10/2019   CREATININE 0.81 12/10/2019   BUN 11 12/10/2019   CO2 26 12/10/2019   TSH 2.49 12/10/2019   HGBA1C 5.0 06/15/2019    Assessment/Plan:  Acute right-sided low back pain with right-sided sciatica -Likely 2/2 muscle strain -Continue supportive care including ice, heat, stretching, massage, topical analgesics -Given handouts  - Plan: predniSONE (DELTASONE) 10 MG tablet  TMJ dysfunction -TTP of right TMJ -Discussed supportive care including NSAIDs, ice, massage, rest -Given handouts  Acute otitis media, unspecified otitis media type -Discussed supportive care including NSAIDs or Tylenol as needed - Plan: azithromycin (ZITHROMAX) 250 MG tablet  Dysfunction of right eustachian tube  - Plan: fluticasone (FLONASE) 50 MCG/ACT nasal spray  F/u as needed  Abbe Amsterdam, MD

## 2020-02-29 DIAGNOSIS — M5441 Lumbago with sciatica, right side: Secondary | ICD-10-CM

## 2020-04-27 DIAGNOSIS — B349 Viral infection, unspecified: Secondary | ICD-10-CM | POA: Diagnosis not present

## 2020-04-27 DIAGNOSIS — R490 Dysphonia: Secondary | ICD-10-CM

## 2020-04-27 NOTE — Progress Notes (Signed)
Virtual Visit via Video Note  I connected with Melissa Oneal  on 04/27/20 at 11:00 AM EST by a video enabled telemedicine application and verified that I am speaking with the correct person using two identifiers.  Location patient: home Location provider:work or home office Persons participating in the virtual visit: patient, provider  I discussed the limitations of evaluation and management by telemedicine and the availability of in person appointments. The patient expressed understanding and agreed to proceed.   HPI: 35 year old female who is being evaluated today for an acute issue.  Symptoms started roughly 5 days ago.  Her symptoms started with a sore throat and then developed into nasal congestion, loss of voice, postnasal drip and a productive cough with dark green mucus.  She denies fevers, chills, night sweats, and vomiting, or diarrhea.  She did feel slightly nauseous yesterday evening but this is since resolved.  She has been using over-the-counter Mucinex cold and flu, but is unsure how much of a benefit she is getting from this medication.  Her sinus congestion seems to be intermittent.  She has had 3 negative rapid COVID test and received her negative PCR test earlier today.  The first 2 days of her symptoms were the worst and then she started to feel better, today she reports taking a step backwards.   ROS: See pertinent positives and negatives per HPI.  Past Medical History:  Diagnosis Date  . Anxiety   . Hypertension     Past Surgical History:  Procedure Laterality Date  . CHOLECYSTECTOMY    . TONSILLECTOMY      Family History  Problem Relation Age of Onset  . Miscarriages / [place] Mother   . Dementia Mother   . Cancer Paternal Aunt   . Hypertension Maternal Grandfather   . Diabetes Maternal Grandfather   . Cancer Paternal Grandmother   . Heart disease Paternal Grandmother   . Hypertension Paternal Grandmother   . Cancer Paternal Grandfather   .  Diabetes Paternal Grandfather   . Dementia Maternal Grandmother        Current Outpatient Medications:  Marland Kitchen  Multiple Vitamin (MULTIVITAMIN) tablet, Take 1 tablet by mouth daily., Disp: , Rfl:   EXAM:  VITALS per patient if applicable:  GENERAL: alert, oriented, appears well and in no acute distress  HEENT: atraumatic, conjunttiva clear, no obvious abnormalities on inspection of external nose and ears  NECK: normal movements of the head and neck  LUNGS: on inspection no signs of respiratory distress, breathing rate appears normal, no obvious gross SOB, gasping or wheezing. + hoarsevoice  CV: no obvious cyanosis  MS: moves all visible extremities without noticeable abnormality  PSYCH/NEURO: pleasant and cooperative, no obvious depression or anxiety, speech and thought processing grossly intact  ASSESSMENT AND PLAN:  Discussed the following assessment and plan:  1. Viral illness -Advised that this is likely viral in origin.  She should start to feel better within the next few days.  If no improvement around day 10 then have her follow-up.  Stay hydrated, rest, can add Flonase.  Okay to continue with Mucinex.  2. Hoarseness -likely from coughing and postnasal drip.  Hopefully Flonase helps with this. - Follow up as needed    I discussed the assessment and treatment plan with the patient. The patient was provided an opportunity to ask questions and all were answered. The patient agreed with the plan and demonstrated an understanding of the instructions.   The patient was advised to call back or seek  an in-person evaluation if the symptoms worsen or if the condition fails to improve as anticipated.   Dorothyann Peng, NP

## 2020-06-30 DIAGNOSIS — R635 Abnormal weight gain: Secondary | ICD-10-CM

## 2020-06-30 DIAGNOSIS — Z0000 Encounter for general adult medical examination without abnormal findings: Secondary | ICD-10-CM | POA: Diagnosis not present

## 2020-06-30 NOTE — Progress Notes (Signed)
Subjective:     Melissa Oneal is a 35 y.o. female and is here for a comprehensive physical exam. The patient reports weight gain.  Patient trying to increase physical activity but finds it difficult with her schedule.   Pt has a 2 yo son.  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  Planning to drink more water daily.  Eating dinner around 7 PM.  Waking up at 530 a.m.   Social History   Socioeconomic History  . Marital status: Married    Spouse name: Not on file  . Number of children: Not on file  . Years of education: Not on file  . Highest education level: Not on file  Occupational History  . Not on file  Tobacco Use  . Smoking status: Never Smoker  . Smokeless tobacco: Never Used  Vaping Use  . Vaping Use: Never used  Substance and Sexual Activity  . Alcohol use: Never  . Drug use: Never  . Sexual activity: Yes    Birth control/protection: None  Other Topics Concern  . Not on file  Social History Narrative  . Not on file   Social Determinants of Health   Financial Resource Strain: Not on file  Food Insecurity: Not on file  Transportation Needs: Not on file  Physical Activity: Not on file  Stress: Not on file  Social Connections: Not on file  Intimate Partner Violence: Not on file   Health Maintenance  Topic Date Due  . Hepatitis C Screening  Never done  . COVID-19 Vaccine (1) 12/17/1996  . PAP SMEAR-Modifier  05/28/2021  . TETANUS/TDAP  01/31/2028  . INFLUENZA VACCINE  Completed  . HIV Screening  Completed  . HPV VACCINES  Aged Out    The following portions of the patient's history were reviewed and updated as appropriate: allergies, current medications, past family history, past medical history, past social history, past surgical history and problem list.  Review of Systems Pertinent items noted in HPI and remainder of comprehensive ROS otherwise negative.   Objective:    BP 120/80 (BP Location: Right Arm, Patient Position:  Sitting, Cuff Size: Large)   Pulse 79   Temp 98.1 F (36.7 C) (Oral)   Ht 5\' 6"  (1.676 m)   Wt 229 lb 12.8 oz (104.2 kg)   SpO2 98%   BMI 37.09 kg/m  General appearance: alert, cooperative and no distress Head: Normocephalic, without obvious abnormality, atraumatic Eyes: conjunctivae/corneas clear. PERRL, EOM's intact. Fundi benign. Ears: normal TM's and external ear canals both ears Nose: Nares normal. Septum midline. Mucosa normal. No drainage or sinus tenderness. Throat: lips, mucosa, and tongue normal; teeth and gums normal Neck: no adenopathy, no carotid bruit, no JVD, supple, symmetrical, trachea midline and thyroid not enlarged, symmetric, no tenderness/mass/nodules Lungs: clear to auscultation bilaterally Heart: regular rate and rhythm, S1, S2 normal, no murmur, click, rub or gallop Abdomen: soft, non-tender; bowel sounds normal; no masses,  no organomegaly Extremities: extremities normal, atraumatic, no cyanosis or edema Pulses: 2+ and symmetric Skin: Skin color, texture, turgor normal. No rashes or lesions Lymph nodes: Cervical, supraclavicular, and axillary nodes normal. Neurologic: Alert and oriented X 3, normal strength and tone. Normal symmetric reflexes. Normal coordination and gait    Assessment:    Healthy female exam.      Plan:     Anticipatory guidance given including wearing seatbelts, smoke detectors in the home, increasing physical activity, increasing p.o. intake of water and vegetables. -We will  obtain labs -Followed by OB/GYN for Pap -Given handout -Next CPE in 1 year See After Visit Summary for Counseling Recommendations    Weight gain  -BMI 37.09 -Discussed lifestyle modifications -Consider weight loss medication options. -Given handout -Patient to notify clinic if attempts to increase physical activity are unsuccessful. - Plan: TSH, T4, Free, Hemoglobin A1c, Lipid panel, Vitamin D, 25-hydroxy, Vitamin B12  F/u prn  Abbe Amsterdam, MD

## 2020-06-30 NOTE — Patient Instructions (Addendum)
Preventive Care 21-36 Years Old, Female Preventive care refers to lifestyle choices and visits with your health care provider that can promote health and wellness. This includes:  A yearly physical exam. This is also called an annual wellness visit.  Regular dental and eye exams.  Immunizations.  Screening for certain conditions.  Healthy lifestyle choices, such as: ? Eating a healthy diet. ? Getting regular exercise. ? Not using drugs or products that contain nicotine and tobacco. ? Limiting alcohol use. What can I expect for my preventive care visit? Physical exam Your health care provider may check your:  Height and weight. These may be used to calculate your BMI (body mass index). BMI is a measurement that tells if you are at a healthy weight.  Heart rate and blood pressure.  Body temperature.  Skin for abnormal spots. Counseling Your health care provider may ask you questions about your:  Past medical problems.  Family's medical history.  Alcohol, tobacco, and drug use.  Emotional well-being.  Home life and relationship well-being.  Sexual activity.  Diet, exercise, and sleep habits.  Work and work environment.  Access to firearms.  Method of birth control.  Menstrual cycle.  Pregnancy history. What immunizations do I need? Vaccines are usually given at various ages, according to a schedule. Your health care provider will recommend vaccines for you based on your age, medical history, and lifestyle or other factors, such as travel or where you work.   What tests do I need? Blood tests  Lipid and cholesterol levels. These may be checked every 5 years starting at age 20.  Hepatitis C test.  Hepatitis B test. Screening  Diabetes screening. This is done by checking your blood sugar (glucose) after you have not eaten for a while (fasting).  STD (sexually transmitted disease) testing, if you are at risk.  BRCA-related cancer screening. This may be  done if you have a family history of breast, ovarian, tubal, or peritoneal cancers.  Pelvic exam and Pap test. This may be done every 3 years starting at age 21. Starting at age 30, this may be done every 5 years if you have a Pap test in combination with an HPV test. Talk with your health care provider about your test results, treatment options, and if necessary, the need for more tests.   Follow these instructions at home: Eating and drinking  Eat a healthy diet that includes fresh fruits and vegetables, whole grains, lean protein, and low-fat dairy products.  Take vitamin and mineral supplements as recommended by your health care provider.  Do not drink alcohol if: ? Your health care provider tells you not to drink. ? You are pregnant, may be pregnant, or are planning to become pregnant.  If you drink alcohol: ? Limit how much you have to 0-1 drink a day. ? Be aware of how much alcohol is in your drink. In the [place] one drink equals one 12 oz bottle of beer (355 mL), one 5 oz glass of wine (148 mL), or one 1 oz glass of hard liquor (44 mL).   Lifestyle  Take daily care of your teeth and gums. Brush your teeth every morning and night with fluoride toothpaste. Floss one time each day.  Stay active. Exercise for at least 30 minutes 5 or more days each week.  Do not use any products that contain nicotine or tobacco, such as cigarettes, e-cigarettes, and chewing tobacco. If you need help quitting, ask your health care provider.  Do not   use drugs.  If you are sexually active, practice safe sex. Use a condom or other form of protection to prevent STIs (sexually transmitted infections).  If you do not wish to become pregnant, use a form of birth control. If you plan to become pregnant, see your health care provider for a prepregnancy visit.  Find healthy ways to cope with stress, such as: ? Meditation, yoga, or listening to music. ? Journaling. ? Talking to a trusted  person. ? Spending time with friends and family. Safety  Always wear your seat belt while driving or riding in a vehicle.  Do not drive: ? If you have been drinking alcohol. Do not ride with someone who has been drinking. ? When you are tired or distracted. ? While texting.  Wear a helmet and other protective equipment during sports activities.  If you have firearms in your house, make sure you follow all gun safety procedures.  Seek help if you have been physically or sexually abused. What's next?  Go to your health care provider once a year for an annual wellness visit.  Ask your health care provider how often you should have your eyes and teeth checked.  Stay up to date on all vaccines. This information is not intended to replace advice given to you by your health care provider. Make sure you discuss any questions you have with your health care provider. Document Revised: 11/29/2019 Document Reviewed: 12/12/2017 Elsevier Patient Education  2021 Elsevier Inc.  Preventing Unhealthy [employer], Adult Staying at a healthy weight is important to your overall health. When fat builds up in your body, you may become overweight or obese. Being overweight or obese increases your risk of developing certain health problems, such as heart disease, diabetes, sleeping problems, joint problems, and some types of cancer. Unhealthy weight gain is often the result of making unhealthy food choices or not getting enough exercise. You can make changes to your lifestyle to prevent obesity and stay as healthy as possible. What nutrition changes can be made?  Eat only as much as your body needs. To do this: ? Pay attention to signs that you are hungry or full. Stop eating as soon as you feel full. ? If you feel hungry, try drinking water first before eating. Drink enough water so your urine is clear or pale yellow. ? Eat smaller portions. Pay attention to portion sizes when eating out. ? Look at  serving sizes on food labels. Most foods contain more than one serving per container. ? Eat the recommended number of calories for your gender and activity level. For most active people, a daily total of 2,000 calories is appropriate. If you are trying to lose weight or are not very active, you may need to eat fewer calories. Talk with your health care provider or a diet and nutrition specialist (dietitian) about how many calories you need each day.  Choose healthy foods, such as: ? Fruits and vegetables. At each meal, try to fill at least half of your plate with fruits and vegetables. ? Whole grains, such as whole-wheat bread, brown rice, and quinoa. ? Lean meats, such as chicken or fish. ? Other healthy proteins, such as beans, eggs, or tofu. ? Healthy fats, such as nuts, seeds, fatty fish, and olive oil. ? Low-fat or fat-free dairy products.  Check food labels, and avoid food and drinks that: ? Are high in calories. ? Have added sugar. ? Are high in sodium. ? Have saturated fats or trans fats.  Cook foods in healthier ways, such as by baking, broiling, or grilling.  Make a meal plan for the week, and shop with a grocery list to help you stay on track with your purchases. Try to avoid going to the grocery store when you are hungry.  When grocery shopping, try to shop around the outside of the store first, where the fresh foods are. Doing this helps you to avoid prepackaged foods, which can be high in sugar, salt (sodium), and fat.   What lifestyle changes can be made?  Exercise for 30 or more minutes on 5 or more days each week. Exercising may include brisk walking, yard work, biking, running, swimming, and team sports like basketball and soccer. Ask your health care provider which exercises are safe for you.  Do muscle-strengthening activities, such as lifting weights or using resistance bands, on 2 or more days a week.  Do not use any products that contain nicotine or tobacco, such as  cigarettes and e-cigarettes. If you need help quitting, ask your health care provider.  Limit alcohol intake to no more than 1 drink a day for nonpregnant women and 2 drinks a day for men. One drink equals 12 oz of beer, 5 oz of wine, or 1 oz of hard liquor.  Try to get 7-9 hours of sleep each night.   What other changes can be made?  Keep a food and activity journal to keep track of: ? What you ate and how many calories you had. Remember to count the calories in sauces, dressings, and side dishes. ? Whether you were active, and what exercises you did. ? Your calorie, weight, and activity goals.  Check your weight regularly. Track any changes. If you notice you have gained weight, make changes to your diet or activity routine.  Avoid taking weight-loss medicines or supplements. Talk to your health care provider before starting any new medicine or supplement.  Talk to your health care provider before trying any new diet or exercise plan. Why are these changes important? Eating healthy, staying active, and having healthy habits can help you to prevent obesity. Those changes also:  Help you manage stress and emotions.  Help you connect with friends and family.  Improve your self-esteem.  Improve your sleep.  Prevent long-term health problems. What can happen if changes are not made? Being obese or overweight can cause you to develop joint or bone problems, which can make it hard for you to stay active or do activities you enjoy. Being obese or overweight also puts stress on your heart and lungs and can lead to health problems like diabetes, heart disease, and some cancers. Where to find more information Talk with your health care provider or a dietitian about healthy eating and healthy lifestyle choices. You may also find information from:  [place]. Department of Agriculture, MyPlate: FormerBoss.no  American Heart Association: www.heart.org  Centers for Disease Control and  Prevention: http://www.wolf.info/ Summary  Staying at a healthy weight is important to your overall health. It helps you to prevent certain diseases and health problems, such as heart disease, diabetes, joint problems, sleep disorders, and some types of cancer.  Being obese or overweight can cause you to develop joint or bone problems, which can make it hard for you to stay active or do activities you enjoy.  You can prevent unhealthy weight gain by eating a healthy diet, exercising regularly, not smoking, limiting alcohol, and getting enough sleep.  Talk with your health care provider or a  dietitian for guidance about healthy eating and healthy lifestyle choices. This information is not intended to replace advice given to you by your health care provider. Make sure you discuss any questions you have with your health care provider. Document Revised: 07/30/2019 Document Reviewed: 07/30/2019 Elsevier Patient Education  2021 [employer] Calories are units of energy. Your body needs a certain number of calories from food to keep going throughout the day. When you eat or drink more calories than your body needs, your body stores the extra calories mostly as fat. When you eat or drink fewer calories than your body needs, your body burns fat to get the energy it needs. Calorie counting means keeping track of how many calories you eat and drink each day. Calorie counting can be helpful if you need to lose weight. If you eat fewer calories than your body needs, you should lose weight. Ask your health care provider what a healthy weight is for you. For calorie counting to work, you will need to eat the right number of calories each day to lose a healthy amount of weight per week. A dietitian can help you figure out how many calories you need in a day and will suggest ways to reach your calorie goal.  A healthy amount of weight to lose each week is usually 1-2 lb (0.5-0.9 kg). This  usually means that your daily calorie intake should be reduced by 500-750 calories.  Eating 1,200-1,500 calories a day can help most women lose weight.  Eating 1,500-1,800 calories a day can help most men lose weight. What do I need to know about calorie counting? Work with your health care provider or dietitian to determine how many calories you should get each day. To meet your daily calorie goal, you will need to:  Find out how many calories are in each food that you would like to eat. Try to do this before you eat.  Decide how much of the food you plan to eat.  Keep a food log. Do this by writing down what you ate and how many calories it had. To successfully lose weight, it is important to balance calorie counting with a healthy lifestyle that includes regular activity. Where do I find calorie information? The number of calories in a food can be found on a Nutrition Facts label. If a food does not have a Nutrition Facts label, try to look up the calories online or ask your dietitian for help. Remember that calories are listed per serving. If you choose to have more than one serving of a food, you will have to multiply the calories per serving by the number of servings you plan to eat. For example, the label on a package of bread might say that a serving size is 1 slice and that there are 90 calories in a serving. If you eat 1 slice, you will have eaten 90 calories. If you eat 2 slices, you will have eaten 180 calories.   How do I keep a food log? After each time that you eat, record the following in your food log as soon as possible:  What you ate. Be sure to include toppings, sauces, and other extras on the food.  How much you ate. This can be measured in cups, ounces, or number of items.  How many calories were in each food and drink.  The total number of calories in the food you ate. Keep your food log near you,  such as in a [employer] or on an app or website on your  mobile phone. Some programs will calculate calories for you and show you how many calories you have left to meet your daily goal. What are some portion-control tips?  Know how many calories are in a serving. This will help you know how many servings you can have of a certain food.  Use a measuring cup to measure serving sizes. You could also try weighing out portions on a kitchen scale. With time, you will be able to estimate serving sizes for some foods.  Take time to put servings of different foods on your favorite plates or in your favorite bowls and cups so you know what a serving looks like.  Try not to eat straight from a food's packaging, such as from a bag or box. Eating straight from the package makes it hard to see how much you are eating and can lead to overeating. Put the amount you would like to eat in a cup or on a plate to make sure you are eating the right portion.  Use smaller plates, glasses, and bowls for smaller portions and to prevent overeating.  Try not to multitask. For example, avoid watching TV or using your computer while eating. If it is time to eat, sit down at a table and enjoy your food. This will help you recognize when you are full. It will also help you be more mindful of what and how much you are eating. What are tips for following this plan? Reading food labels  Check the calorie count compared with the serving size. The serving size may be smaller than what you are used to eating.  Check the source of the calories. Try to choose foods that are high in protein, fiber, and vitamins, and low in saturated fat, trans fat, and sodium. Shopping  Read nutrition labels while you shop. This will help you make healthy decisions about which foods to buy.  Pay attention to nutrition labels for low-fat or fat-free foods. These foods sometimes have the same number of calories or more calories than the full-fat versions. They also often have added sugar, starch, or salt  to make up for flavor that was removed with the fat.  Make a grocery list of lower-calorie foods and stick to it. Cooking  Try to cook your favorite foods in a healthier way. For example, try baking instead of frying.  Use low-fat dairy products. Meal planning  Use more fruits and vegetables. One-half of your plate should be fruits and vegetables.  Include lean proteins, such as chicken, [place], and fish. Lifestyle Each week, aim to do one of the following:  150 minutes of moderate exercise, such as walking.  75 minutes of vigorous exercise, such as running. General information  Know how many calories are in the foods you eat most often. This will help you calculate calorie counts faster.  Find a way of tracking calories that works for you. Get creative. Try different apps or programs if writing down calories does not work for you. What foods should I eat?  Eat nutritious foods. It is better to have a nutritious, high-calorie food, such as an avocado, than a food with few nutrients, such as a bag of potato chips.  Use your calories on foods and drinks that will fill you up and will not leave you hungry soon after eating. ? Examples of foods that fill you up are nuts and nut butters, vegetables,  lean proteins, and high-fiber foods such as whole grains. High-fiber foods are foods with more than 5 g of fiber per serving.  Pay attention to calories in drinks. Low-calorie drinks include water and unsweetened drinks. The items listed above may not be a complete list of foods and beverages you can eat. Contact a dietitian for more information.   What foods should I limit? Limit foods or drinks that are not good sources of vitamins, minerals, or protein or that are high in unhealthy fats. These include:  Candy.  Other sweets.  Sodas, specialty coffee drinks, alcohol, and juice. The items listed above may not be a complete list of foods and beverages you should avoid. Contact a  dietitian for more information. How do I count calories when eating out?  Pay attention to portions. Often, portions are much larger when eating out. Try these tips to keep portions smaller: ? Consider sharing a meal instead of getting your own. ? If you get your own meal, eat only half of it. Before you start eating, ask for a container and put half of your meal into it. ? When available, consider ordering smaller portions from the menu instead of full portions.  Pay attention to your food and drink choices. Knowing the way food is cooked and what is included with the meal can help you eat fewer calories. ? If calories are listed on the menu, choose the lower-calorie options. ? Choose dishes that include vegetables, fruits, whole grains, low-fat dairy products, and lean proteins. ? Choose items that are boiled, broiled, grilled, or steamed. Avoid items that are buttered, battered, fried, or served with cream sauce. Items labeled as crispy are usually fried, unless stated otherwise. ? Choose water, low-fat milk, unsweetened iced tea, or other drinks without added sugar. If you want an alcoholic beverage, choose a lower-calorie option, such as a glass of wine or light beer. ? Ask for dressings, sauces, and syrups on the side. These are usually high in calories, so you should limit the amount you eat. ? If you want a salad, choose a garden salad and ask for grilled meats. Avoid extra toppings such as bacon, cheese, or fried items. Ask for the dressing on the side, or ask for olive oil and vinegar or lemon to use as dressing.  Estimate how many servings of a food you are given. Knowing serving sizes will help you be aware of how much food you are eating at restaurants. Where to find more information  Centers for Disease Control and Prevention: http://www.wolf.info/  [place]. Department of Agriculture: http://www.wilson-mendoza.org/ Summary  Calorie counting means keeping track of how many calories you eat and drink each day. If  you eat fewer calories than your body needs, you should lose weight.  A healthy amount of weight to lose per week is usually 1-2 lb (0.5-0.9 kg). This usually means reducing your daily calorie intake by 500-750 calories.  The number of calories in a food can be found on a Nutrition Facts label. If a food does not have a Nutrition Facts label, try to look up the calories online or ask your dietitian for help.  Use smaller plates, glasses, and bowls for smaller portions and to prevent overeating.  Use your calories on foods and drinks that will fill you up and not leave you hungry shortly after a meal. This information is not intended to replace advice given to you by your health care provider. Make sure you discuss any questions you have with  your health care provider. Document Revised: 05/14/2019 Document Reviewed: 05/14/2019 Elsevier Patient Education  2021 [employer].

## 2020-07-05 NOTE — Progress Notes (Signed)
Results viewed on MyChart. 

## 2020-10-25 DIAGNOSIS — J329 Chronic sinusitis, unspecified: Secondary | ICD-10-CM | POA: Insufficient documentation

## 2020-10-25 DIAGNOSIS — J309 Allergic rhinitis, unspecified: Secondary | ICD-10-CM | POA: Insufficient documentation

## 2020-11-24 DIAGNOSIS — H6991 Unspecified Eustachian tube disorder, right ear: Secondary | ICD-10-CM | POA: Insufficient documentation

## 2020-12-12 DIAGNOSIS — F900 Attention-deficit hyperactivity disorder, predominantly inattentive type: Secondary | ICD-10-CM | POA: Diagnosis not present

## 2020-12-12 DIAGNOSIS — M25531 Pain in right wrist: Secondary | ICD-10-CM | POA: Diagnosis not present

## 2020-12-12 DIAGNOSIS — M25532 Pain in left wrist: Secondary | ICD-10-CM

## 2020-12-12 DIAGNOSIS — R202 Paresthesia of skin: Secondary | ICD-10-CM | POA: Diagnosis not present

## 2020-12-12 NOTE — Progress Notes (Signed)
Subjective:    Patient ID: Melissa Oneal, female    DOB: Dec 16, 1985, 35 y.o.   MRN: [id number]  Chief Complaint  Patient presents with   Carpal Tunnel    Both, rt is significantly worse    ADHD  Pt accompanied by her young son.  HPI Patient was seen today for ongoing concerns.  Pt with b/l wrist pain, R>L, and numbness in fingers.  Shaking hands does not resolve the sensation.  Pt wakes up at night 2/2 the discomfort.  R 3rd and 4th digits numb intermittently.  Pt is R handed.  R hand cramps when writing.  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  Pt unsure of hand edema as they typically swell during the summer.  Denies dropping items 2/2 weakness.  Pt notes difficulty concentrating on tasks at work.  May wake up at in the middle of the night to finish working on items.  Pt notes h/o ADD in HS and college.  Was on medication., does not recall name.  Past Medical History:  Diagnosis Date   Anxiety    Hypertension     Allergies  Allergen Reactions   Cephalosporins Rash    ROS General: Denies fever, chills, night sweats, changes in weight, changes in appetite HEENT: Denies headaches, ear pain, changes in vision, rhinorrhea, sore throat CV: Denies CP, palpitations, SOB, orthopnea Pulm: Denies SOB, cough, wheezing GI: Denies abdominal pain, nausea, vomiting, diarrhea, constipation GU: Denies dysuria, hematuria, frequency, vaginal discharge Msk: Denies muscle cramps, joint pains Neuro: Denies weakness  +numbness, tingling   Skin: Denies rashes, bruising Psych: Denies depression, anxiety, hallucinations +inattention, difficulty concentrating on task    Objective:    Blood pressure 128/84, pulse 97, temperature 98.8 F (37.1 C), temperature source Oral, weight 233 lb 12.8 oz (106.1 kg), SpO2 97 %.  Gen. Pleasant, well-nourished, in no distress, normal affect   HEENT: /AT, face symmetric, conjunctiva clear, no scleral  icterus, PERRLA, EOMI, nares patent without drainage, Lungs: no accessory muscle use Cardiovascular: RRR, no peripheral edema Musculoskeletal: Positive Tinel's of right wrist.  Mild discomfort with tapping on R lateral and medial epicondyles.  Negative Phalen's. No deformities, no cyanosis or clubbing, normal tone Neuro:  A&Ox3, CN II-XII intact, normal gait Skin:  Warm, no lesions/ rash   Wt Readings from Last 3 Encounters:  12/12/20 233 lb 12.8 oz (106.1 kg)  06/30/20 229 lb 12.8 oz (104.2 kg)  04/27/20 228 lb (103.4 kg)    Lab Results  Component Value Date   WBC 10.2 06/30/2020   HGB 13.8 06/30/2020   HCT 40.4 06/30/2020   PLT 278.0 06/30/2020   GLUCOSE 87 06/30/2020   CHOL 155 06/30/2020   TRIG 82.0 06/30/2020   HDL 44.90 06/30/2020   LDLCALC 94 06/30/2020   ALT 22 12/10/2019   AST 15 12/10/2019   NA 139 06/30/2020   K 4.4 06/30/2020   CL 104 06/30/2020   CREATININE 0.69 06/30/2020   BUN 12 06/30/2020   CO2 27 06/30/2020   TSH 2.38 06/30/2020   HGBA1C 5.0 06/30/2020    Assessment/Plan:  Attention deficit hyperactivity disorder (ADHD), predominantly inattentive type  -h/o ADHD in HS and college -Per chart review was on Adderall in 2013. -ADHD self reported symptom check list completed by pt -PDMP reviewed -will start Adderall 10 mg BID -f/u in 1 month - Plan: amphetamine-dextroamphetamine (ADDERALL) 10 MG tablet  Pain in both wrists -discussed possible causes including carpal tunnel -  Discussed supportive care including ergonomic modifications to workspace, wearing wrist splints at night, NSAIDs, Tylenol, heat, ice, OT -We will also try vitamin B6 200 mg daily -For continued or worsening symptoms EMG/NCS  Paresthesia of both hands -Likely 2/2 carpal tunnel -Vitamin B12 840, vitamin D 43, and TSH normal on 06/30/2020 -Consider repeating labs for continued or worsening symptoms  F/u in 1 month  Abbe Amsterdam, MD

## 2020-12-12 NOTE — Patient Instructions (Signed)
You can find a wrist brace at your local drug store, online, or at [facility] or [facility].  It should not be on tight.  You can wear it at night.   You can also try vitamin B6 200 mg daily can also help with symptoms.

## 2021-01-05 DIAGNOSIS — F900 Attention-deficit hyperactivity disorder, predominantly inattentive type: Secondary | ICD-10-CM

## 2021-01-05 NOTE — Progress Notes (Signed)
Virtual Visit via Video Note  I connected with Melissa Oneal on 01/05/21 at  8:30 AM EDT by a video enabled telemedicine application 2/2 COVID-19 pandemic and verified that I am speaking with the correct person using two identifiers.  Location patient: home Location provider:work or home office Persons participating in the virtual visit: patient, provider  I discussed the limitations of evaluation and management by telemedicine and the availability of in person appointments. The patient expressed understanding and agreed to proceed.   HPI: Pt feeling less scattered since taking Adderall 10 mg.  Takes at 7:30 am and can tell it wears off around lunch. At times will take 5 mg in the afternoon.  Not sure it is as helpful.  Does not take med on the weekends or Mondays.  Pt has been able to get most things done during the day, but still has work to take home.  Denies change in appetite or insomnia.  Notes mood is better.  ROS: See pertinent positives and negatives per HPI.  Past Medical History:  Diagnosis Date   Anxiety    Hypertension     Past Surgical History:  Procedure Laterality Date   CHOLECYSTECTOMY     TONSILLECTOMY      Family History  Problem Relation Age of Onset   Miscarriages / Stillbirths Mother    Dementia Mother    Cancer Paternal Aunt    Hypertension Maternal Grandfather    Diabetes Maternal Grandfather    Cancer Paternal Grandmother    Heart disease Paternal Grandmother    Hypertension Paternal Grandmother    Cancer Paternal Grandfather    Diabetes Paternal Grandfather    Dementia Maternal Grandmother      Current Outpatient Medications:    amphetamine-dextroamphetamine (ADDERALL) 10 MG tablet, Take 1 tablet (10 mg total) by mouth 2 (two) times daily with a meal. Afternoon dose no later than 1 pm., Disp: 30 tablet, Rfl: 0   Multiple Vitamin (MULTIVITAMIN) tablet, Take 1 tablet by mouth daily., Disp: , Rfl:    sodium fluoride (PREVIDENT 5000 PLUS) 1.1 %  CREA dental cream, APPLY PEA SIZE AMOUNT TO ALL SURFACES OF TEETH VIA BRUSH FOR 2 MINUTES ONE TIME DAILY. THEN SPIT OUT, Disp: , Rfl:   EXAM:  VITALS per patient if applicable: RR between 12-20 bpm  GENERAL: alert, oriented, appears well and in no acute distress  HEENT: atraumatic, conjunctiva clear, no obvious abnormalities on inspection of external nose and ears  NECK: normal movements of the head and neck  LUNGS: on inspection no signs of respiratory distress, breathing rate appears normal, no obvious gross SOB, gasping or wheezing  CV: no obvious cyanosis  MS: moves all visible extremities without noticeable abnormality  PSYCH/NEURO: pleasant and cooperative, no obvious depression or anxiety, speech and thought processing grossly intact  ASSESSMENT AND PLAN:  Discussed the following assessment and plan:  Attention deficit hyperactivity disorder (ADHD), predominantly inattentive type  -Improving -Discussed taking Adderall 10 mg twice daily over the next few weeks.  Patient will notify provider how concentration is as dose may need to be increased.  If needed will send in prescription for increased dose in the next few weeks. -PDMP reviewed. -We will follow-up every 3 months - Plan: amphetamine-dextroamphetamine (ADDERALL) 10 MG tablet   I discussed the assessment and treatment plan with the patient. The patient was provided an opportunity to ask questions and all were answered. The patient agreed with the plan and demonstrated an understanding of the instructions.   The  patient was advised to call back or seek an in-person evaluation if the symptoms worsen or if the condition fails to improve as anticipated.   Deeann Saint, MD

## 2021-05-23 DIAGNOSIS — F900 Attention-deficit hyperactivity disorder, predominantly inattentive type: Secondary | ICD-10-CM

## 2021-05-23 NOTE — Telephone Encounter (Signed)
Last OV 9/22

## 2021-05-25 NOTE — Telephone Encounter (Signed)
ATC, unable to leave a message.  

## 2021-05-25 NOTE — Telephone Encounter (Signed)
Patient should schedule follow-up appointment for ADHD as last seen 12/2020.

## 2021-05-26 NOTE — Telephone Encounter (Signed)
Pt notified that f/u appt needed for medication refill. Pt verb understanding. VV scheduled for 2/13. CPE also scheduled for 3/31

## 2021-05-29 DIAGNOSIS — F900 Attention-deficit hyperactivity disorder, predominantly inattentive type: Secondary | ICD-10-CM | POA: Diagnosis not present

## 2021-05-29 NOTE — Progress Notes (Signed)
Virtual Visit via Video Note  I connected with Melissa Oneal, Esq on 05/29/21 at  4:30 PM EST by a video enabled telemedicine application 2/2 COVID-19 pandemic and verified that I am speaking with the correct person using two identifiers.  Location patient: home Location provider:work or home office Persons participating in the virtual visit: patient, provider  I discussed the limitations of evaluation and management by telemedicine and the availability of in person appointments. The patient expressed understanding and agreed to proceed. Chief Complaint  Patient presents with   Medication Management     HPI: Pt seen for f/u on ADHD and refill.  Pt started on Adderrall 10 mg BID in Sept 2022. Notices a difference if does not take the afternoon dose.  Pt endorses improved focus while taking the medication and ability to complete assignments.  Patient denies insomnia, changes in appetite, or in mood.  [facility]  Requesting refill of medication however her regular pharmacy does not have Adderall in stock as it is on backorder.   ROS: See pertinent positives and negatives per HPI.  Past Medical History:  Diagnosis Date   Anxiety    Hypertension     Past Surgical History:  Procedure Laterality Date   CHOLECYSTECTOMY     TONSILLECTOMY      Family History  Problem Relation Age of Onset   Miscarriages / Stillbirths Mother    Dementia Mother    Cancer Paternal Aunt    Hypertension Maternal Grandfather    Diabetes Maternal Grandfather    Cancer Paternal Grandmother    Heart disease Paternal Grandmother    Hypertension Paternal Grandmother    Cancer Paternal Grandfather    Diabetes Paternal Grandfather    Dementia Maternal Grandmother      Current Outpatient Medications:    amphetamine-dextroamphetamine (ADDERALL) 10 MG tablet, Take 1 tablet (10 mg total) by mouth 2 (two) times daily with a meal. Afternoon dose no later than 1 pm., Disp: 60 tablet, Rfl: 0    Multiple Vitamin (MULTIVITAMIN) tablet, Take 1 tablet by mouth daily., Disp: , Rfl:    sodium fluoride (PREVIDENT 5000 PLUS) 1.1 % CREA dental cream, APPLY PEA SIZE AMOUNT TO ALL SURFACES OF TEETH VIA BRUSH FOR 2 MINUTES ONE TIME DAILY. THEN SPIT OUT, Disp: , Rfl:   EXAM:  VITALS per patient if applicable: RR between 12-20 bpm.  GENERAL: alert, oriented, appears well and in no acute distress  HEENT: atraumatic, conjunctiva clear, no obvious abnormalities on inspection of external nose and ears  NECK: normal movements of the head and neck  LUNGS: on inspection no signs of respiratory distress, breathing rate appears normal, no obvious gross SOB, gasping or wheezing  CV: no obvious cyanosis  MS: moves all visible extremities without noticeable abnormality  PSYCH/NEURO: pleasant and cooperative, no obvious depression or anxiety, speech and thought processing grossly intact  ASSESSMENT AND PLAN:  Discussed the following assessment and plan:  Attention deficit hyperactivity disorder (ADHD), predominantly inattentive type -stable -will restart Adderrall 10 mg BID.  Will send rx to another pharmacy, as pt's regular pharmacy does not have medication in stock.  Pt will contact clinic if the [facility] at [address] does not have medication. -PDMP reviewed. -Rxs for 3 months sent in  - Plan: amphetamine-dextroamphetamine (ADDERALL) 10 MG tablet, amphetamine-dextroamphetamine (ADDERALL) 10 MG tablet, amphetamine-dextroamphetamine (ADDERALL) 10 MG tablet  F/u in 3 months for ADHD.  Had CPE in a few wks.   I discussed the assessment and treatment  plan with the patient. The patient was provided an opportunity to ask questions and all were answered. The patient agreed with the plan and demonstrated an understanding of the instructions.   The patient was advised to call back or seek an in-person evaluation if the symptoms worsen or if the condition fails to improve as  anticipated.  Deeann Saint, MD

## 2021-07-14 DIAGNOSIS — J302 Other seasonal allergic rhinitis: Secondary | ICD-10-CM

## 2021-07-14 DIAGNOSIS — I10 Essential (primary) hypertension: Secondary | ICD-10-CM

## 2021-07-14 DIAGNOSIS — S61211S Laceration without foreign body of left index finger without damage to nail, sequela: Secondary | ICD-10-CM

## 2021-07-14 DIAGNOSIS — M25559 Pain in unspecified hip: Secondary | ICD-10-CM

## 2021-07-14 DIAGNOSIS — Z1159 Encounter for screening for other viral diseases: Secondary | ICD-10-CM

## 2021-07-14 DIAGNOSIS — M25532 Pain in left wrist: Secondary | ICD-10-CM | POA: Diagnosis not present

## 2021-07-14 DIAGNOSIS — Z0000 Encounter for general adult medical examination without abnormal findings: Secondary | ICD-10-CM | POA: Diagnosis not present

## 2021-07-14 DIAGNOSIS — M79645 Pain in left finger(s): Secondary | ICD-10-CM

## 2021-07-14 DIAGNOSIS — M7061 Trochanteric bursitis, right hip: Secondary | ICD-10-CM

## 2021-07-14 DIAGNOSIS — Z6837 Body mass index (BMI) 37.0-37.9, adult: Secondary | ICD-10-CM | POA: Diagnosis not present

## 2021-07-14 DIAGNOSIS — E6609 Other obesity due to excess calories: Secondary | ICD-10-CM | POA: Diagnosis not present

## 2021-07-14 DIAGNOSIS — F900 Attention-deficit hyperactivity disorder, predominantly inattentive type: Secondary | ICD-10-CM

## 2021-07-14 NOTE — Progress Notes (Signed)
Subjective:  ?  ? Eilah Angelique BlonderDenise Deal Jeannette HowMcCorkle is a 36 y.o. female and is here for a comprehensive physical exam. The patient reports doing well overall.  Pt hit her L hand against a doorknob 2 months ago, but still having tenderness at site when touched.  Initially went to UC a few days after the injury.  Xrays at the time were negative for fx.  Pt also notes laceration to L index finger 2 months or so ago.  Pt thinks the cut was deep as she had difficulty achieving hemostasis.  Patient did not seek care for laceration.  Patient now with pain/tenderness with palpation of lateral left index finger and edema.  Able to bend finger without difficulty.  Patient states she is doing well on Adderall 10 mg twice daily.  Denies insomnia, decreased appetite, significant change in weight.  Patient is exercising regularly and eating healthy.  Patient concerned about BP as she had a history of gHTN.  Patient recently started wearing glasses for certain activities.  Patient notes increased allergy/sinus symptoms.  Taking OTC Claritin.  Was using Flonase regularly but stopped 2/2 epistaxis.  Patient endorses right lateral hip pain times a few days.  Initially noted pain with getting up from sitting position.  Patient denies recent injury.  [facility] [employer] [employer] [facility]  Has difficulty describing pain, unsure if a pulling sensation/tightness.  Tried stretching. ? ?Social History  ? ?Socioeconomic History  ? Marital status: Married  ?  Spouse name: Not on file  ? Number of children: Not on file  ? Years of education: Not on file  ? Highest education level: Not on file  ?Occupational History  ? Not on file  ?Tobacco Use  ? Smoking status: Never  ? Smokeless tobacco: Never  ?Vaping Use  ? Vaping Use: Never used  ?Substance and Sexual Activity  ? Alcohol use: Never  ? Drug use: Never  ? Sexual activity: Yes  ?  Birth control/protection: None  ?Other Topics Concern  ? Not on file  ?Social History Narrative  ? Not on file   ? ?Social Determinants of Health  ? ?Financial Resource Strain: Not on file  ?Food Insecurity: Not on file  ?Transportation Needs: Not on file  ?Physical Activity: Not on file  ?Stress: Not on file  ?Social Connections: Not on file  ?Intimate Partner Violence: Not on file  ? ?Health Maintenance  ?Topic Date Due  ? COVID-19 Vaccine (1) 06/16/1985  ? Hepatitis C Screening  Never done  ? PAP SMEAR-Modifier  05/28/2021  ? INFLUENZA VACCINE  07/14/2021 (Originally 11/14/2020)  ? TETANUS/TDAP  01/31/2028  ? HIV Screening  Completed  ? HPV VACCINES  Aged Out  ? ?Social history: Patient's son is now 37 years old. ? ?The following portions of the patient's history were reviewed and updated as appropriate: allergies, current medications, past family history, past medical history, past social history, past surgical history, and problem list. ? ?Review of Systems ?Pertinent items noted in HPI and remainder of comprehensive ROS otherwise negative.  ? ?Objective:  ? ? BP 138/80 (BP Location: Right Arm, Cuff Size: Normal)   Pulse 83   Temp 98.7 ?F (37.1 ?C) (Oral)   Ht 5' 6.5" (1.689 m)   Wt 229 lb 12.8 oz (104.2 kg)   SpO2 99%   BMI 36.54 kg/m?  ?General appearance: alert, cooperative, and no distress ?Head: Normocephalic, without obvious abnormality, atraumatic ?Eyes: conjunctivae/corneas clear. PERRL, EOM's intact. Fundi benign.,  Allergic  shiners b/l ?Ears: normal TM's and external ear canals both ears ?Nose: Nares normal. Septum midline. Mucosa normal.  Clear drainage, no sinus tenderness. ?Throat: lips, mucosa, and tongue normal; teeth and gums normal ?Neck: no adenopathy, no carotid bruit, no JVD, supple, symmetrical, trachea midline, and thyroid not enlarged, symmetric, no tenderness/mass/nodules ?Lungs: clear to auscultation bilaterally ?Heart: regular rate and rhythm, S1, S2 normal, no murmur, click, rub or gallop ?Abdomen: soft, non-tender; bowel sounds normal; no masses,  no organomegaly ?Extremities: extremities  normal, atraumatic, no cyanosis or edema.  TTP of proximal right lateral hip.  Negative straight leg raise, logroll, FADIR, FABER.  TTP of left lateral index finger surrounding well-healed laceration. ?Pulses: 2+ and symmetric ?Skin: Skin color, texture, turgor normal. No rashes or lesions well-healed laceration of left index finger with hypertrophy surrounding area and mild erythema.  Area is not hot to touch or fluctuant, no induration. ?Lymph nodes: Cervical, supraclavicular, and axillary nodes normal. ?Neurologic: Alert and oriented X 3, normal strength and tone. Normal symmetric reflexes. Normal coordination and gait  ?  ?Assessment:  ? ? Healthy female exam with tenderness to palpation of left lateral wrist and left lateral index finger status post injury months ago.  Also with acute right lateral hip pain and elevated BP. ?  ?Plan:  ? ? Anticipatory guidance given including wearing seatbelts, smoke detectors in the home, increasing physical activity, increasing p.o. intake of water and vegetables. ?-Obtain labs ?-Mammogram and colonoscopy not yet indicated 2/2 age ?-Followed by OB/GYN for Pap ?-Immunizations reviewed ?-Given handout ?-Next CPE in 1 year ?See After Visit Summary for Counseling Recommendations  ? ?Essential hypertension  ?-Mildly elevated for pt. ?-Recheck ?-Continue lifestyle modifications ?-Patient encouraged to continue checking BP at home and keeping a log to bring with her to clinic ?-For consistent elevation greater than 140/90 start medication ?- Plan: Basic metabolic panel, TSH, T4, Free, Lipid panel ? ?Laceration of left index finger without damage to nail, foreign body presence unspecified, sequela ?-Healed by secondary intention as patient did not seek care at the time of injury ?-Concern for scar tissue versus partial tendon laceration though has full ROM of left second digit. ?-Consider referral to OT and hand surgery ? ?Pain of finger of left hand ?-Tenderness to palpation of  healed laceration ?- Plan: CBC with Differential/Platelet ? ?Pain in left wrist ?-Continued tenderness to palpation in left wrist s/p injury 2 months ago. ?-Though initial imaging at urgent care days after injury was negative discussed repeating imaging as fractures can often take longer to appear. ?-Treatment of symptoms Tylenol, ibuprofen, topical analgesics, ice, heat, support as needed. ?-Obtain x-ray left wrist. ?-Further recommendations based on imaging results. ?- Plan: DG Wrist Complete Left, CBC with Differential/Platelet ? ?Seasonal allergic rhinitis, unspecified trigger  ?-Allergic shiners on exam ?-Continue OTC antihistamine.  Discussed trying a different antihistamine such as Zyrtec, Allegra, Xyzal. ?-Given samples of Zyrtec in clinic ?-Can also use saline nasal rinse while taking a break from Flonase. ?-For continued or worsening symptoms consider adding Singulair. ?- Plan: CBC with Differential/Platelet ? ?Class 2 obesity due to excess calories without serious comorbidity with body mass index (BMI) of 37.0 to 37.9 in adult ?-Continue lifestyle modifications ?-Patient congratulated on 5 pound weight loss ?- Plan: CBC with Differential/Platelet, Basic metabolic panel, TSH, T4, Free, Hemoglobin A1c, Lipid panel ? ?Encounter for hepatitis C screening test for low risk patient ?- Plan: Hep C Antibody ? ?Trochanteric bursitis of right hip ?-Treatment of symptoms with OTC NSAIDs or  Tylenol, topical analgesics such as Aspercreme, stretching, massage, rest, heat, ice ?-Patient encouraged to wear supportive shoes ? ?Hip pain ? ?ADHD, predominantly inattentive type ?-Stable ?-We will continue Adderall 10 mg twice daily ?-PMD P reviewed ?-Patient has one rx remaining at pharmacy.  We will have patient notify clinic after she picks up the prescription so an additional 54-months of refills can be sent in. ? ?F/u in 6 months, sooner if needed ? ?Abbe Amsterdam, MD ? ?

## 2021-07-14 NOTE — Patient Instructions (Signed)
Let us know when you pick up your last prescription for Adderall, sent another 3 months prescriptions can be sent into your pharmacy. ?

## 2021-12-04 DIAGNOSIS — J189 Pneumonia, unspecified organism: Secondary | ICD-10-CM | POA: Diagnosis not present

## 2021-12-04 DIAGNOSIS — I10 Essential (primary) hypertension: Secondary | ICD-10-CM | POA: Diagnosis not present

## 2021-12-04 NOTE — Patient Instructions (Signed)
Take the last tab of Prednisone 20 mg tonight. Start the new prescription for the prednisone taper tomorrow morning.  The new prescription will have you taking prednisone 40 mg tomorrow morning and the next morning, then decreasing by 10 mg each morning for the next few days.

## 2021-12-04 NOTE — Progress Notes (Signed)
Subjective:    Patient ID: Melissa Oneal, female    DOB: 27-Aug-1985, 36 y.o.   MRN: [id number]  Chief Complaint  Patient presents with   Follow-up    Was dx with pneumonia at [facility] on [address] on Friday right after lunch. Still has a cough, fever broke Saturday morning. Started steroid, accidentally doubled dose and now cough is worse, and feels a tightening in chest when breathes in. Is scheduled for work tomorrow.     HPI Patient was seen today for f/u on acute issue.  Pt seen at [facility] on last Friday 12/01/21, dx'd with pna.  Pt states she was feeling sick for a few wks prior to going to UC.  Pt given doxycycline, albuterol, and prednisone burst.  Pt states she took prednisone incorrectly, 2 tabs BID instead of 2 tabs daily, has one tab left.  Still with cough, now feeling a tightness in chest, instead of a discomfort in back when symptoms started.  Fever broke Saturday am.  Pt notes bp was elevated during UC visit.  Was taking mucinex for 2 wks then coricidin.  Past Medical History:  Diagnosis Date   Anxiety    Hypertension     Allergies  Allergen Reactions   Cephalosporins Rash    ROS General: Denies fever, chills, night sweats, changes in weight, changes in appetite  +fever-resolved HEENT: Denies headaches, ear pain, changes in vision, rhinorrhea, sore throat CV: Denies CP, palpitations, SOB, orthopnea Pulm: Denies SOB,  wheezing +cough GI: Denies abdominal pain, nausea, vomiting, diarrhea, constipation GU: Denies dysuria, hematuria, frequency, vaginal discharge Msk: Denies muscle cramps, joint pains Neuro: Denies weakness, numbness, tingling Skin: Denies rashes, bruising Psych: Denies depression, anxiety, hallucinations     Objective:    Blood pressure (!) 138/98, pulse 86, temperature 98.7 F (37.1 C), temperature source Oral, weight 23 lb 3.2 oz (10.5 kg), SpO2 95 %.  Gen. Pleasant, well-nourished, in no distress, normal affect   HEENT: [place]/AT, face  symmetric, conjunctiva clear, no scleral icterus, PERRLA, EOMI, nares patent without drainage Lungs: no accessory muscle use, CTAB, no wheezes or rales Cardiovascular: RRR, no m/r/g, no peripheral edema Neuro:  A&Ox3, CN II-XII intact, normal gait Skin:  Warm, no lesions/ rash   Wt Readings from Last 3 Encounters:  12/04/21 23 lb 3.2 oz (10.5 kg)  07/14/21 229 lb 12.8 oz (104.2 kg)  05/29/21 233 lb 12.8 oz (106.1 kg)    Lab Results  Component Value Date   WBC 11.7 (H) 07/14/2021   HGB 14.1 07/14/2021   HCT 40.3 07/14/2021   PLT 328.0 07/14/2021   GLUCOSE 94 07/14/2021   CHOL 166 07/14/2021   TRIG 125.0 07/14/2021   HDL 40.90 07/14/2021   LDLCALC 100 (H) 07/14/2021   ALT 22 12/10/2019   AST 15 12/10/2019   NA 140 07/14/2021   K 4.2 07/14/2021   CL 105 07/14/2021   CREATININE 0.75 07/14/2021   BUN 12 07/14/2021   CO2 25 07/14/2021   TSH 3.20 07/14/2021   HGBA1C 5.2 07/14/2021    Assessment/Plan:  Pneumonia due to infectious organism, unspecified laterality, unspecified part of lung  -Continue doxycycline 7 day course -Albuterol inhaler as needed -Given precautions for continued/worsening symptoms - Plan: predniSONE (DELTASONE) 10 MG tablet  Essential hypertension -uncontrolled -possibly 2/2 current illness and use of cough meds.  Consider prednisone use contributing to elevation however patient reports BP elevated at UC. -will have pt monitor at home -bp recheck in clinic in the  next few wks.  F/u prn  Abbe Amsterdam, MD

## 2021-12-21 DIAGNOSIS — H65193 Other acute nonsuppurative otitis media, bilateral: Secondary | ICD-10-CM | POA: Diagnosis not present

## 2021-12-21 DIAGNOSIS — J0100 Acute maxillary sinusitis, unspecified: Secondary | ICD-10-CM | POA: Diagnosis not present

## 2021-12-21 DIAGNOSIS — J029 Acute pharyngitis, unspecified: Secondary | ICD-10-CM | POA: Diagnosis not present

## 2021-12-21 DIAGNOSIS — I10 Essential (primary) hypertension: Secondary | ICD-10-CM | POA: Diagnosis not present

## 2021-12-21 DIAGNOSIS — R059 Cough, unspecified: Secondary | ICD-10-CM | POA: Diagnosis not present

## 2021-12-21 NOTE — Progress Notes (Signed)
Subjective:    Patient ID: Melissa Oneal, female    DOB: [birth year], 36 y.o.   MRN: [id number]  Chief Complaint  Patient presents with  . Follow-up    BP Never completely got rid of cough from pneumonia, woke up yday with sore throat. When blows her nose, it is bright green. Has post nasal drip, HA, congestion, cough and sore throat.  Took some coricidin and some ibuprofen yesterday, but has not raken anything this morning.    HPI Patient was seen today for acute concern.  Patient endorses improvement in cough s/p d/x of pneumonia 12/01/2021.  Completed course of doxycycline, prednisone and albuterol.  Patient notes return of cough, sore throat, congestion, headache, facial pressure, and chills yesterday.  Denies ear pain, nausea, vomiting, fever.  Taking OTC daytime cough/cold medication.  BP remains elevated  Past Medical History:  Diagnosis Date  . Anxiety   . Hypertension     Allergies  Allergen Reactions  . Cephalosporins Rash    ROS General: Denies fever, chills, night sweats, changes in weight, changes in appetite HEENT: Denies headaches, ear pain, changes in vision +rhinorrhea, sore throat CV: Denies CP, palpitations, SOB, orthopnea Pulm: Denies SOB, cough, wheezing GI: Denies abdominal pain, nausea, vomiting, diarrhea, constipation GU: Denies dysuria, hematuria, frequency, vaginal discharge Msk: Denies muscle cramps, joint pains Neuro: Denies weakness, numbness, tingling Skin: Denies rashes, bruising Psych: Denies depression, anxiety, hallucinations     Objective:    Blood pressure (!) 132/100, pulse 98, temperature 98.7 F (37.1 C), temperature source Oral, weight 234 lb (106.1 kg), SpO2 96 %.   Gen. Pleasant, well-nourished, in no distress, normal affect   HEENT: [place]/AT, face symmetric, conjunctiva clear, no scleral icterus, PERRLA, EOMI, TTP of maxillary sinuses.  No TTP frontal sinuses.  Nares patent without drainage, pharynx with erythema, no  exudate.  Bilateral TMs full and erythematous left greater than right.  No cervical lymphadenopathy. Lungs: no accessory muscle use, CTAB, no wheezes or rales Cardiovascular: RRR, no m/r/g, no peripheral edema Abdomen: BS present, soft, NT/ND, no hepatosplenomegaly. Musculoskeletal: No deformities, no cyanosis or clubbing, normal tone Neuro:  A&Ox3, CN II-XII intact, normal gait Skin:  Warm, no lesions/ rash  BP Readings from Last 3 Encounters:  12/21/21 (!) 132/100  12/04/21 (!) 138/98  07/14/21 138/80     Wt Readings from Last 3 Encounters:  12/21/21 234 lb (106.1 kg)  12/04/21 234 lb (106.1 kg)  07/14/21 229 lb 12.8 oz (104.2 kg)    Lab Results  Component Value Date   WBC 11.7 (H) 07/14/2021   HGB 14.1 07/14/2021   HCT 40.3 07/14/2021   PLT 328.0 07/14/2021   GLUCOSE 94 07/14/2021   CHOL 166 07/14/2021   TRIG 125.0 07/14/2021   HDL 40.90 07/14/2021   LDLCALC 100 (H) 07/14/2021   ALT 22 12/10/2019   AST 15 12/10/2019   NA 140 07/14/2021   K 4.2 07/14/2021   CL 105 07/14/2021   CREATININE 0.75 07/14/2021   BUN 12 07/14/2021   CO2 25 07/14/2021   TSH 3.20 07/14/2021   HGBA1C 5.2 07/14/2021    Assessment/Plan:  No diagnosis found.  F/u ***  Abbe Amsterdam, MD

## 2021-12-21 NOTE — Patient Instructions (Addendum)
A prescription for hydrochlorothiazide 12.5 mg was sent to your pharmacy.  This is a medication for blood pressure.  For some people the medication may cause increased urination.  If you notice this and it is bothersome to you we can always change the medication.  While taking blood pressure medication make sure you are drinking plenty of water each day.  As blood pressure medication can make you feel dizzy/lightheaded if you are not.  It is highly likely that no recent illnesses, prescribed medications, and cough/cold medication may be contributing to the elevation in her blood pressure.  We will continue to monitor your blood pressure with hopes that it will return to normal we can stop the medication.  If you are considering becoming pregnant please let us know as we can change to a different medication for blood pressure if still needed.

## 2022-01-30 NOTE — Telephone Encounter (Signed)
Pt called to ask for a refill of the following amphetamine-dextroamphetamine (ADDERALL) 10 MG tablet  12/21/2021  [id number] - [place], [place] - [address] Phone:  [phone]  Fax:  [phone]

## 2022-02-01 DIAGNOSIS — F900 Attention-deficit hyperactivity disorder, predominantly inattentive type: Secondary | ICD-10-CM

## 2022-02-01 NOTE — Telephone Encounter (Signed)
LM for patient to schedule OV for medication

## 2022-02-01 NOTE — Telephone Encounter (Signed)
Pt checking on progress of this refill, states she is out

## 2022-02-05 DIAGNOSIS — F900 Attention-deficit hyperactivity disorder, predominantly inattentive type: Secondary | ICD-10-CM

## 2022-02-05 DIAGNOSIS — Z23 Encounter for immunization: Secondary | ICD-10-CM | POA: Diagnosis not present

## 2022-02-05 DIAGNOSIS — I10 Essential (primary) hypertension: Secondary | ICD-10-CM | POA: Diagnosis not present

## 2022-02-05 NOTE — Addendum Note (Signed)
Addended by: Anderson Malta on: 02/05/2022 04:19 PM   Modules accepted: Orders

## 2022-02-05 NOTE — Patient Instructions (Signed)
Monitor blood pressure at home once or twice per day over the next week.  If you notice elevations in BP consistently greater than or equal to 140/90, either number, notify the clinic.  We will then increase your blood pressure medicine hydrochlorothiazide from 12.5 to 25 mg daily.  For some people the increased dose of medication causes the potassium to be a little lower than normal.  In this case they often take a potassium supplement

## 2022-02-05 NOTE — Progress Notes (Signed)
Subjective:    Patient ID: Melissa Oneal, female    DOB: May 30, 1985, 36 y.o.   MRN: [id number]  Chief Complaint  Patient presents with   Medication Refill    Adderall and BP check.    HPI Patient was seen today for f/u and med refills.  Pt taking hydrochlorothiazide 12.5 mg daily.  Has noticed urinary frequency, headaches, changes in vision, SOB, LE edema.  Has not really been checking BP at home due to feeling good.   Patient taking Adderall 10 mg twice daily.  States notices improvement in concentration and ability to complete task when taking medication.  Feels a little scattered when not taking medication.  Past Medical History:  Diagnosis Date   Anxiety    Hypertension     Allergies  Allergen Reactions   Cephalosporins Rash    ROS General: Denies fever, chills, night sweats, changes in weight, changes in appetite HEENT: Denies headaches, ear pain, changes in vision, rhinorrhea, sore throat CV: Denies CP, palpitations, SOB, orthopnea Pulm: Denies SOB, cough, wheezing GI: Denies abdominal pain, nausea, vomiting, diarrhea, constipation GU: Denies dysuria, hematuria, frequency, vaginal discharge Msk: Denies muscle cramps, joint pains Neuro: Denies weakness, numbness, tingling Skin: Denies rashes, bruising Psych: Denies depression, anxiety, hallucinations  + inattention     Objective:    Blood pressure (!) 138/92, pulse 82, temperature 98.9 F (37.2 C), temperature source Oral, weight 235 lb 3.2 oz (106.7 kg), SpO2 96 %.  Gen. Pleasant, well-nourished, in no distress, normal affect   HEENT: [place]/AT, face symmetric, conjunctiva clear, no scleral icterus, PERRLA, EOMI, nares patent without drainage, pharynx without erythema or exudate. Neck: No JVD, no thyromegaly, no carotid bruits Lungs: no accessory muscle use, CTAB, no wheezes or rales Cardiovascular: RRR, no m/r/g, no peripheral edema Neuro:  A&Ox3, CN II-XII intact, normal gait Skin:  Warm, no lesions/  rash   Wt Readings from Last 3 Encounters:  02/05/22 235 lb 3.2 oz (106.7 kg)  12/21/21 234 lb (106.1 kg)  12/04/21 234 lb (106.1 kg)    Lab Results  Component Value Date   WBC 11.7 (H) 07/14/2021   HGB 14.1 07/14/2021   HCT 40.3 07/14/2021   PLT 328.0 07/14/2021   GLUCOSE 94 07/14/2021   CHOL 166 07/14/2021   TRIG 125.0 07/14/2021   HDL 40.90 07/14/2021   LDLCALC 100 (H) 07/14/2021   ALT 22 12/10/2019   AST 15 12/10/2019   NA 140 07/14/2021   K 4.2 07/14/2021   CL 105 07/14/2021   CREATININE 0.75 07/14/2021   BUN 12 07/14/2021   CO2 25 07/14/2021   TSH 3.20 07/14/2021   HGBA1C 5.2 07/14/2021    Assessment/Plan:  Essential hypertension -Elevated -Recheck BP 138/92 -Continue lifestyle modifications -Continue hydrochlorothiazide 12.5 mg daily -Patient encouraged to check BP at home and notify clinic for readings consistently greater than 140/90.  For consistent elevation increase HCTZ from 12.5 mg to 25 mg daily and add potassium supplement. -If needed consider switching Adderall to a different ADHD medication as may be contributing to elevation in BP -Given handouts  Attention deficit hyperactivity disorder (ADHD), predominantly inattentive type -Improvement in inattention on medication -Continue Adderall 10 mg twice daily -Monitor BP at home.  For continued BP elevation consider holding Adderall and switching to a different ADHD medication. -PDMP reviewed. -Rx for 24-month supply sent to pharmacy.  - Plan: amphetamine-dextroamphetamine (ADDERALL) 10 MG tablet, amphetamine-dextroamphetamine (ADDERALL) 10 MG tablet, amphetamine-dextroamphetamine (ADDERALL) 10 MG tablet  Need for influenza vaccination -Influenza  vaccine given this visit  F/u in 4-6 wks for HTN.  In 3 months for ADHD  Grier Mitts, MD

## 2022-03-12 DIAGNOSIS — I10 Essential (primary) hypertension: Secondary | ICD-10-CM | POA: Diagnosis not present

## 2022-03-12 NOTE — Progress Notes (Signed)
Subjective:    Patient ID: Melissa Oneal, female    DOB: [birth year], 36 y.o.   MRN: [id number]  Chief Complaint  Patient presents with   Follow-up    BP  Pt accompanied by her son.  HPI Patient was seen today for follow-up on BP.  Patient taking hydrochlorothiazide 12.5 mg daily.  States BP at home typically 130s/80s to lower 90s.  Patient denies headaches, CP, changes in vision.  Patient has not taken in 1 week.  Patient concerned about continued BP elevation due to family history.  Patient's dad takes olmesartan and Norvasc for BP,dx'd in his 47s.  Patient still interested in weight loss medication options.  Past Medical History:  Diagnosis Date   Anxiety    Hypertension     Allergies  Allergen Reactions   Cephalosporins Rash    ROS General: Denies fever, chills, night sweats, changes in appetite.  +changes in wt HEENT: Denies headaches, ear pain, changes in vision, rhinorrhea, sore throat CV: Denies CP, palpitations, SOB, orthopnea Pulm: Denies SOB, cough, wheezing GI: Denies abdominal pain, nausea, vomiting, diarrhea, constipation GU: Denies dysuria, hematuria, frequency, vaginal discharge Msk: Denies muscle cramps, joint pains Neuro: Denies weakness, numbness, tingling Skin: Denies rashes, bruising Psych: Denies depression, anxiety, hallucinations     Objective:    Blood pressure (!) 138/98, pulse 92, temperature 98.9 F (37.2 C), temperature source Oral, weight 237 lb 9.6 oz (107.8 kg), SpO2 96 %.  Gen. Pleasant, well-nourished, in no distress, normal affect   HEENT: [place]/AT, face symmetric, conjunctiva clear, no scleral icterus, PERRLA, EOMI, nares patent without drainage Lungs: no accessory muscle use, CTAB, no wheezes or rales Cardiovascular: RRR, no m/r/g, no peripheral edema Neuro:  A&Ox3, CN II-XII intact, normal gait  Wt Readings from Last 3 Encounters:  03/12/22 237 lb 9.6 oz (107.8 kg)  02/05/22 235 lb 3.2 oz (106.7 kg)  12/21/21 234 lb  (106.1 kg)    Lab Results  Component Value Date   WBC 11.7 (H) 07/14/2021   HGB 14.1 07/14/2021   HCT 40.3 07/14/2021   PLT 328.0 07/14/2021   GLUCOSE 94 07/14/2021   CHOL 166 07/14/2021   TRIG 125.0 07/14/2021   HDL 40.90 07/14/2021   LDLCALC 100 (H) 07/14/2021   ALT 22 12/10/2019   AST 15 12/10/2019   NA 140 07/14/2021   K 4.2 07/14/2021   CL 105 07/14/2021   CREATININE 0.75 07/14/2021   BUN 12 07/14/2021   CO2 25 07/14/2021   TSH 3.20 07/14/2021   HGBA1C 5.2 07/14/2021    Assessment/Plan:  Essential hypertension -Elevated -Will d/c hydrochlorothiazide 12.5 mg daily -Start olmesartan 10 mg daily -For continued elevation consistently greater than 140/90 increase olmesartan to 20 mg daily. -Continue lifestyle modifications  - Plan: olmesartan (BENICAR) 20 MG tablet  Patient awaiting appointment with weight management clinic.  Given information about p.o. weight loss medications to research.  F/u 4-8 weeks, sooner if needed  Abbe Amsterdam, MD

## 2022-03-12 NOTE — Patient Instructions (Signed)
Stop taking hydrochlorothiazide 12.5 mg daily.  New prescription for olmesartan 10 mg was sent to your pharmacy.  As they do not make a 10 mg tab you will need to break a 20 mg tablet in half or have the pharmacy split the pills for you if they are too difficult to break.  Continue monitoring your blood pressure at home.  If need be we can increase the dose of olmesartan.  We will have you follow back up in the next 4 to 8 weeks, sooner if needed for continued BP elevation greater than 140/90.

## 2022-03-28 NOTE — Telephone Encounter (Signed)
Matter addressed during follow-up in clinic.

## 2022-04-05 DIAGNOSIS — B379 Candidiasis, unspecified: Secondary | ICD-10-CM

## 2022-04-05 DIAGNOSIS — H66002 Acute suppurative otitis media without spontaneous rupture of ear drum, left ear: Secondary | ICD-10-CM

## 2022-04-05 DIAGNOSIS — T3695XA Adverse effect of unspecified systemic antibiotic, initial encounter: Secondary | ICD-10-CM | POA: Diagnosis not present

## 2022-04-05 NOTE — Progress Notes (Signed)
Virtual Visit Consent   Orris Grasse Deal Quintella Reichert, you are scheduled for a virtual visit with a [facility] provider today. Just as with appointments in the office, your consent must be obtained to participate. Your consent will be active for this visit and any virtual visit you may have with one of our providers in the next 365 days. If you have a MyChart account, a copy of this consent can be sent to you electronically.  As this is a virtual visit, video technology does not allow for your provider to perform a traditional examination. This may limit your provider's ability to fully assess your condition. If your provider identifies any concerns that need to be evaluated in person or the need to arrange testing (such as labs, EKG, etc.), we will make arrangements to do so. Although advances in technology are sophisticated, we cannot ensure that it will always work on either your end or our end. If the connection with a video visit is poor, the visit may have to be switched to a telephone visit. With either a video or telephone visit, we are not always able to ensure that we have a secure connection.  By engaging in this virtual visit, you consent to the provision of healthcare and authorize for your insurance to be billed (if applicable) for the services provided during this visit. Depending on your insurance coverage, you may receive a charge related to this service.  I need to obtain your verbal consent now. Are you willing to proceed with your visit today? Carolyne Langley Gauss Deal Hromadka has provided verbal consent on 04/05/2022 for a virtual visit (video or telephone). Perlie Mayo, NP  Date: 04/05/2022 11:05 AM  Virtual Visit via Video Note   I, Perlie Mayo, connected with  Thomas  ([id number], 1985/11/17) on 04/05/22 at 11:00 AM EST by a video-enabled telemedicine application and verified that I am speaking with the correct person using two  identifiers.  Location: Patient: Virtual Visit Location Patient: Home Provider: Virtual Visit Location Provider: Home Office   I discussed the limitations of evaluation and management by telemedicine and the availability of in person appointments. The patient expressed understanding and agreed to proceed.    History of Present Illness: Glenice Dillner Deal Grabiec is a 36 y.o. who identifies as a female who was assigned female at birth, and is being seen today for current on going ear pain and pressure being treated for ear infection- left ear- finished prednisone- had mild to moderate while taking that- once stopped- ear pressure and pain returned. Denies fevers, chills, chest pain, shortness of breath, headache, sore throat. Has been taking Amoxicillin and using Flonase as directed. Denies blood or pus drainage from ear at this time. History of ear infections has used Augmentin to help- also has followed with ENT before.   See UC note in Epic from 04/01/2022 for details   Problems:  Patient Active Problem List   Diagnosis Date Noted   Attention deficit hyperactivity disorder (ADHD), predominantly inattentive type 12/12/2020   Vacuum-assisted vaginal delivery 04/11/2018   Postpartum care following VAVD (12/25) 04/11/2018   Second degree perineal laceration 04/11/2018   Acute blood loss anemia 04/11/2018   Normal labor 04/09/2018    Allergies:  Allergies  Allergen Reactions   Cephalosporins Rash   Medications:  Current Outpatient Medications:    amphetamine-dextroamphetamine (ADDERALL) 10 MG tablet, Take 1 tablet (10 mg total) by mouth 2 (two) times daily with a meal. Afternoon dose no  later than 1 pm., Disp: 60 tablet, Rfl: 0   amphetamine-dextroamphetamine (ADDERALL) 10 MG tablet, Take 1 tablet (10 mg total) by mouth 2 (two) times daily with a meal. Afternoon dose no later than 1 pm., Disp: 60 tablet, Rfl: 0   amphetamine-dextroamphetamine (ADDERALL) 10 MG tablet, Take 1 tablet  (10 mg total) by mouth 2 (two) times daily with a meal. Afternoon dose no later than 1 pm., Disp: 60 tablet, Rfl: 0   hydrochlorothiazide (HYDRODIURIL) 12.5 MG tablet, Take 1 tablet (12.5 mg total) by mouth daily., Disp: 30 tablet, Rfl: 3   loratadine (CLARITIN) 10 MG tablet, Take 1 tablet by mouth daily., Disp: , Rfl:    Multiple Vitamin (MULTIVITAMIN) tablet, Take 1 tablet by mouth daily., Disp: , Rfl:    olmesartan (BENICAR) 20 MG tablet, Take 0.5 tablets (10 mg total) by mouth daily., Disp: 45 tablet, Rfl: 1   sodium fluoride (PREVIDENT 5000 PLUS) 1.1 % CREA dental cream, APPLY PEA SIZE AMOUNT TO ALL SURFACES OF TEETH VIA BRUSH FOR 2 MINUTES ONE TIME DAILY. THEN SPIT OUT, Disp: , Rfl:   Observations/Objective: Patient is well-developed, well-nourished in no acute distress.  Resting comfortably  at home.  Head is normocephalic, atraumatic.  No labored breathing.  Speech is clear and coherent with logical content.  Patient is alert and oriented at baseline.    Assessment and Plan:  1. Non-recurrent acute suppurative otitis media of left ear without spontaneous rupture of tympanic membrane  - amoxicillin-clavulanate (AUGMENTIN) 875-125 MG tablet; Take 1 tablet by mouth 2 (two) times daily for 7 days.  Dispense: 14 tablet; Refill: 0  2. Antibiotic-induced yeast infection  - fluconazole (DIFLUCAN) 150 MG tablet; Take 1 tablet (150 mg total) by mouth as directed. May repeat in 3 days  Dispense: 2 tablet; Refill: 0  -appears to have on going symptoms of ear infection- possible resistance- will switch to Augmentin- this has worked for her in the past -long discussion about allergy/viral triggers of recurrent and repeat ear and or sinus infections  -OTC options reviewed    Reviewed side effects, risks and benefits of medication.    Patient acknowledged agreement and understanding of the plan.   Past Medical, Surgical, Social History, Allergies, and Medications have been  Reviewed.     Follow Up Instructions: I discussed the assessment and treatment plan with the patient. The patient was provided an opportunity to ask questions and all were answered. The patient agreed with the plan and demonstrated an understanding of the instructions.  A copy of instructions were sent to the patient via MyChart unless otherwise noted below.    The patient was advised to call back or seek an in-person evaluation if the symptoms worsen or if the condition fails to improve as anticipated.  Time:  I spent 10 minutes with the patient via telehealth technology discussing the above problems/concerns.    Freddy Finner, NP

## 2022-04-05 NOTE — Patient Instructions (Signed)
[employer], thank you for joining Freddy Finner, NP for today's virtual visit.  While this provider is not your primary care provider (PCP), if your PCP is located in our provider database this encounter information will be shared with them immediately following your visit.   A [facility] MyChart account gives you access to today's visit and all your visits, tests, and labs performed at [facility] " click here if you don't have a [facility] MyChart account or go to [email or web address]  Consent: (Patient) Melissa Oneal provided verbal consent for this virtual visit at the beginning of the encounter.  Current Medications:  Current Outpatient Medications:    amoxicillin-clavulanate (AUGMENTIN) 875-125 MG tablet, Take 1 tablet by mouth 2 (two) times daily for 7 days., Disp: 14 tablet, Rfl: 0   fluconazole (DIFLUCAN) 150 MG tablet, Take 1 tablet (150 mg total) by mouth as directed. May repeat in 3 days, Disp: 2 tablet, Rfl: 0   amphetamine-dextroamphetamine (ADDERALL) 10 MG tablet, Take 1 tablet (10 mg total) by mouth 2 (two) times daily with a meal. Afternoon dose no later than 1 pm., Disp: 60 tablet, Rfl: 0   amphetamine-dextroamphetamine (ADDERALL) 10 MG tablet, Take 1 tablet (10 mg total) by mouth 2 (two) times daily with a meal. Afternoon dose no later than 1 pm., Disp: 60 tablet, Rfl: 0   amphetamine-dextroamphetamine (ADDERALL) 10 MG tablet, Take 1 tablet (10 mg total) by mouth 2 (two) times daily with a meal. Afternoon dose no later than 1 pm., Disp: 60 tablet, Rfl: 0   hydrochlorothiazide (HYDRODIURIL) 12.5 MG tablet, Take 1 tablet (12.5 mg total) by mouth daily., Disp: 30 tablet, Rfl: 3   loratadine (CLARITIN) 10 MG tablet, Take 1 tablet by mouth daily., Disp: , Rfl:    Multiple Vitamin (MULTIVITAMIN) tablet, Take 1 tablet by mouth daily., Disp: , Rfl:    olmesartan (BENICAR) 20 MG tablet, Take 0.5 tablets (10 mg total) by mouth daily.,  Disp: 45 tablet, Rfl: 1   sodium fluoride (PREVIDENT 5000 PLUS) 1.1 % CREA dental cream, APPLY PEA SIZE AMOUNT TO ALL SURFACES OF TEETH VIA BRUSH FOR 2 MINUTES ONE TIME DAILY. THEN SPIT OUT, Disp: , Rfl:    Medications ordered in this encounter:  Meds ordered this encounter  Medications   amoxicillin-clavulanate (AUGMENTIN) 875-125 MG tablet    Sig: Take 1 tablet by mouth 2 (two) times daily for 7 days.    Dispense:  14 tablet    Refill:  0    Order Specific Question:   Supervising Provider    Answer:   Merrilee Jansky [[id number]]   fluconazole (DIFLUCAN) 150 MG tablet    Sig: Take 1 tablet (150 mg total) by mouth as directed. May repeat in 3 days    Dispense:  2 tablet    Refill:  0    Order Specific Question:   Supervising Provider    Answer:   Merrilee Jansky [[id number]]     *If you need refills on other medications prior to your next appointment, please contact your pharmacy*  Follow-Up: Call back or seek an in-person evaluation if the symptoms worsen or if the condition fails to improve as anticipated.  [facility] [phone]  Other Instructions  Otitis Media, Adult  Otitis media is a condition in which the middle ear is red and swollen (inflamed) and full of fluid. The middle ear is the part of the ear that contains bones for hearing  as well as air that helps send sounds to the brain. The condition usually goes away on its own. What are the causes? This condition is caused by a blockage in the eustachian tube. This tube connects the middle ear to the back of the nose. It normally allows air into the middle ear. The blockage is caused by fluid or swelling. Problems that can cause blockage include: A cold or infection that affects the nose, mouth, or throat. Allergies. An irritant, such as tobacco smoke. Adenoids that have become large. The adenoids are soft tissue located in the back of the throat, behind the nose and the roof of the mouth. Growth or  swelling in the upper part of the throat, just behind the nose (nasopharynx). Damage to the ear caused by a change in pressure. This is called barotrauma. What increases the risk? You are more likely to develop this condition if you: Smoke or are exposed to tobacco smoke. Have an opening in the roof of your mouth (cleft palate). Have acid reflux. Have problems in your body's defense system (immune system). What are the signs or symptoms? Symptoms of this condition include: Ear pain. Fever. Problems with hearing. Being tired. Fluid leaking from the ear. Ringing in the ear. How is this treated? This condition can go away on its own within 3-5 days. But if the condition is caused by germs (bacteria) and does not go away on its own, or if it keeps coming back, your doctor may: Give you antibiotic medicines. Give you medicines for pain. Follow these instructions at home: Take over-the-counter and prescription medicines only as told by your doctor. If you were prescribed an antibiotic medicine, take it as told by your doctor. Do not stop taking it even if you start to feel better. Keep all follow-up visits. Contact a doctor if: You have bleeding from your nose. There is a lump on your neck. You are not feeling better in 5 days. You feel worse instead of better. Get help right away if: You have pain that is not helped with medicine. You have swelling, redness, or pain around your ear. You get a stiff neck. You cannot move part of your face (paralysis). You notice that the bone behind your ear hurts when you touch it. You get a very bad headache. Summary Otitis media means that the middle ear is red, swollen, and full of fluid. This condition usually goes away on its own. If the problem does not go away, treatment may be needed. You may be given medicines to treat the infection or to treat your pain. If you were prescribed an antibiotic medicine, take it as told by your doctor. Do not  stop taking it even if you start to feel better. Keep all follow-up visits. This information is not intended to replace advice given to you by your health care provider. Make sure you discuss any questions you have with your health care provider. Document Revised: 07/11/2020 Document Reviewed: 07/11/2020 Elsevier Patient Education  2023 Elsevier Inc.    If you have been instructed to have an in-person evaluation today at a local Urgent Care facility, please use the link below. It will take you to a list of all of our available [facility], including address, phone number and hours of operation. Please do not delay care.  [facility]  If you or a family member do not have a primary care provider, use the link below to schedule a visit and establish  care. When you choose a [facility] primary care physician or advanced practice provider, you gain a long-term partner in health. Find a Primary Care Provider  Learn more about [facility] in-office and virtual care options: [facility]

## 2022-04-16 DIAGNOSIS — U071 COVID-19: Secondary | ICD-10-CM | POA: Diagnosis not present

## 2022-04-16 NOTE — Patient Instructions (Signed)
[employer], thank you for joining Chaney Malling, PA for today's virtual visit.  While this provider is not your primary care provider (PCP), if your PCP is located in our provider database this encounter information will be shared with them immediately following your visit.   [facility] account gives you access to today's visit and all your visits, tests, and labs performed at [facility] " click here if you don't have a [facility] account or go to [email or web address]  Consent: (Patient) Melissa Oneal provided verbal consent for this virtual visit at the beginning of the encounter.  Current Medications:  Current Outpatient Medications:    amphetamine-dextroamphetamine (ADDERALL) 10 MG tablet, Take 1 tablet (10 mg total) by mouth 2 (two) times daily with a meal. Afternoon dose no later than 1 pm., Disp: 60 tablet, Rfl: 0   amphetamine-dextroamphetamine (ADDERALL) 10 MG tablet, Take 1 tablet (10 mg total) by mouth 2 (two) times daily with a meal. Afternoon dose no later than 1 pm., Disp: 60 tablet, Rfl: 0   amphetamine-dextroamphetamine (ADDERALL) 10 MG tablet, Take 1 tablet (10 mg total) by mouth 2 (two) times daily with a meal. Afternoon dose no later than 1 pm., Disp: 60 tablet, Rfl: 0   fluconazole (DIFLUCAN) 150 MG tablet, Take 1 tablet (150 mg total) by mouth as directed. May repeat in 3 days, Disp: 2 tablet, Rfl: 0   hydrochlorothiazide (HYDRODIURIL) 12.5 MG tablet, Take 1 tablet (12.5 mg total) by mouth daily., Disp: 30 tablet, Rfl: 3   loratadine (CLARITIN) 10 MG tablet, Take 1 tablet by mouth daily., Disp: , Rfl:    Multiple Vitamin (MULTIVITAMIN) tablet, Take 1 tablet by mouth daily., Disp: , Rfl:    olmesartan (BENICAR) 20 MG tablet, Take 0.5 tablets (10 mg total) by mouth daily., Disp: 45 tablet, Rfl: 1   sodium fluoride (PREVIDENT 5000 PLUS) 1.1 % CREA dental cream, APPLY PEA SIZE AMOUNT TO ALL SURFACES OF TEETH  VIA BRUSH FOR 2 MINUTES ONE TIME DAILY. THEN SPIT OUT, Disp: , Rfl:    Medications ordered in this encounter:  No orders of the defined types were placed in this encounter.    *If you need refills on other medications prior to your next appointment, please contact your pharmacy*  Follow-Up: Call back or seek an in-person evaluation if the symptoms worsen or if the condition fails to improve as anticipated.  [place] [place] [phone]  Other Instructions You are positive for covid, but do not meet treatment criteria for an antiviral. Please continue to isolate/ quarantine at home for two additional days. Wear a N95 mask for 5 days after quarantine period is completed. Use saline/ steam to help with sinus congestion and flonase nasal spray to help with taste.   If you have been instructed to have an in-person evaluation today at a local Urgent Care facility, please use the link below. It will take you to a list of all of our available [facility], including address, phone number and hours of operation. Please do not delay care.  [facility]  If you or a family member do not have a primary care provider, use the link below to schedule a visit and establish care. When you choose a [facility] primary care physician or advanced practice provider, you gain a long-term partner in health. Find a Primary Care Provider  Learn more about 's in-office and virtual care options: [facility]

## 2022-04-16 NOTE — Progress Notes (Signed)
Virtual Visit Consent   Melissa Oneal, you are scheduled for a virtual visit with a [facility] provider today. Just as with appointments in the office, your consent must be obtained to participate. Your consent will be active for this visit and any virtual visit you may have with one of our providers in the next 365 days. If you have a MyChart account, a copy of this consent can be sent to you electronically.  As this is a virtual visit, video technology does not allow for your provider to perform a traditional examination. This may limit your provider's ability to fully assess your condition. If your provider identifies any concerns that need to be evaluated in person or the need to arrange testing (such as labs, EKG, etc.), we will make arrangements to do so. Although advances in technology are sophisticated, we cannot ensure that it will always work on either your end or our end. If the connection with a video visit is poor, the visit may have to be switched to a telephone visit. With either a video or telephone visit, we are not always able to ensure that we have a secure connection.  By engaging in this virtual visit, you consent to the provision of healthcare and authorize for your insurance to be billed (if applicable) for the services provided during this visit. Depending on your insurance coverage, you may receive a charge related to this service.  I need to obtain your verbal consent now. Are you willing to proceed with your visit today? Melissa Oneal has provided verbal consent on 04/16/2022 for a virtual visit (video or telephone). Chaney Malling, PA  Date: 04/16/2022 9:42 PM  Virtual Visit via Video Note   I, [place], connected with  Melissa Oneal  ([id number], [birth year]) on 04/16/22 at  9:30 PM EST by a video-enabled telemedicine application and verified that I am speaking with the correct person using two  identifiers.  Location: Patient: Virtual Visit Location Patient: Home Provider: Virtual Visit Location Provider: Home Office   I discussed the limitations of evaluation and management by telemedicine and the availability of in person appointments. The patient expressed understanding and agreed to proceed.    History of Present Illness: Melissa Oneal is a 37 y.o. who identifies as a female who was assigned female at birth, and is being seen today for covid.  HPI: Pt states she has been dealing with sinus and ear issues for the past two weeks. She just completed a course of Augmentin, but on 04/14/22 noticed she could not taste or smell. She took a covid test today and states it was positive. She otherwise feels fine. Denies cough, fever, SOB, CP, palps. No headache or body aches. Sinus pain improved and since the 30th, taste and smell is "improving" but not resolved.    Problems:  Patient Active Problem List   Diagnosis Date Noted   Attention deficit hyperactivity disorder (ADHD), predominantly inattentive type 12/12/2020   Vacuum-assisted vaginal delivery 04/11/2018   Postpartum care following VAVD (12/25) 04/11/2018   Second degree perineal laceration 04/11/2018   Acute blood loss anemia 04/11/2018   Normal labor 04/09/2018    Allergies:  Allergies  Allergen Reactions   Cephalosporins Rash   Medications:  Current Outpatient Medications:    amphetamine-dextroamphetamine (ADDERALL) 10 MG tablet, Take 1 tablet (10 mg total) by mouth 2 (two) times daily with a meal. Afternoon dose no later than 1 pm., Disp: 60 tablet,  Rfl: 0   amphetamine-dextroamphetamine (ADDERALL) 10 MG tablet, Take 1 tablet (10 mg total) by mouth 2 (two) times daily with a meal. Afternoon dose no later than 1 pm., Disp: 60 tablet, Rfl: 0   amphetamine-dextroamphetamine (ADDERALL) 10 MG tablet, Take 1 tablet (10 mg total) by mouth 2 (two) times daily with a meal. Afternoon dose no later than 1 pm.,  Disp: 60 tablet, Rfl: 0   fluconazole (DIFLUCAN) 150 MG tablet, Take 1 tablet (150 mg total) by mouth as directed. May repeat in 3 days, Disp: 2 tablet, Rfl: 0   hydrochlorothiazide (HYDRODIURIL) 12.5 MG tablet, Take 1 tablet (12.5 mg total) by mouth daily., Disp: 30 tablet, Rfl: 3   loratadine (CLARITIN) 10 MG tablet, Take 1 tablet by mouth daily., Disp: , Rfl:    Multiple Vitamin (MULTIVITAMIN) tablet, Take 1 tablet by mouth daily., Disp: , Rfl:    olmesartan (BENICAR) 20 MG tablet, Take 0.5 tablets (10 mg total) by mouth daily., Disp: 45 tablet, Rfl: 1   sodium fluoride (PREVIDENT 5000 PLUS) 1.1 % CREA dental cream, APPLY PEA SIZE AMOUNT TO ALL SURFACES OF TEETH VIA BRUSH FOR 2 MINUTES ONE TIME DAILY. THEN SPIT OUT, Disp: , Rfl:   Observations/Objective: Patient is well-developed, well-nourished in no acute distress.  Resting comfortably at home. Pt appears well Head is normocephalic, atraumatic.  No labored breathing. No cough Speech is clear and coherent with logical content.  Patient is alert and oriented at baseline.  No nasal congestion  Assessment and Plan: 1. COVID-19  You are on day 3 of covid. You do not meet treatment criteria for the antiviral therapy. Supportive measures appropriate, out of work for 2 additional days. ER precautions reviewed.  Follow Up Instructions: I discussed the assessment and treatment plan with the patient. The patient was provided an opportunity to ask questions and all were answered. The patient agreed with the plan and demonstrated an understanding of the instructions.  A copy of instructions were sent to the patient via MyChart unless otherwise noted below.    The patient was advised to call back or seek an in-person evaluation if the symptoms worsen or if the condition fails to improve as anticipated.  Time:  I spent 10 minutes with the patient via telehealth technology discussing the above problems/concerns.    [place], PA

## 2022-07-19 DIAGNOSIS — I10 Essential (primary) hypertension: Secondary | ICD-10-CM

## 2022-07-19 DIAGNOSIS — Z0000 Encounter for general adult medical examination without abnormal findings: Secondary | ICD-10-CM

## 2022-07-19 DIAGNOSIS — Z6837 Body mass index (BMI) 37.0-37.9, adult: Secondary | ICD-10-CM | POA: Diagnosis not present

## 2022-07-19 DIAGNOSIS — F900 Attention-deficit hyperactivity disorder, predominantly inattentive type: Secondary | ICD-10-CM | POA: Diagnosis not present

## 2022-07-19 DIAGNOSIS — E6609 Other obesity due to excess calories: Secondary | ICD-10-CM | POA: Diagnosis not present

## 2022-07-19 MED ORDER — AMPHETAMINE-DEXTROAMPHETAMINE 10 MG PO TABS
10.0000 mg | ORAL_TABLET | Freq: Two times a day (BID) | ORAL | 0 refills | Status: DC
Start: 2022-07-19 — End: 2023-03-07

## 2022-07-19 NOTE — Progress Notes (Signed)
Established Patient Office Visit   Subjective  Patient ID: Melissa Oneal, female    DOB: 1985/07/25  Age: 37 y.o. MRN: [id number]  Chief Complaint  Patient presents with   Annual Exam    Patient is a 37 year old female seen for CPE.  Patient states she is exercising regularly and eating out healthy overall but still having difficulty losing much weight.  Has lost about 7 pounds and has lost inches.  Drinking protein powder in the morning, eating lunch and having dinner around 7-730pm.  Patient inquires about further options to assist with weight loss, though does not wish to be on medications long-term.  ADHD stable on Adderall.  BP improving.      ROS Negative unless stated above    Objective:     BP 120/80 (BP Location: Right Arm, Patient Position: Sitting, Cuff Size: Large)   Pulse 68   Temp 98.6 F (37 C) (Oral)   Ht 5\' 6"  (1.676 m)   Wt 232 lb 6.4 oz (105.4 kg)   SpO2 99%   BMI 37.51 kg/m    Physical Exam Constitutional:      Appearance: Normal appearance.  HENT:     Head: Normocephalic and atraumatic.     Right Ear: Tympanic membrane, ear canal and external ear normal.     Left Ear: Tympanic membrane, ear canal and external ear normal.     Nose: Nose normal.     Mouth/Throat:     Mouth: Mucous membranes are moist.     Pharynx: No oropharyngeal exudate or posterior oropharyngeal erythema.  Eyes:     General: No scleral icterus.    Extraocular Movements: Extraocular movements intact.     Conjunctiva/sclera: Conjunctivae normal.     Pupils: Pupils are equal, round, and reactive to light.  Neck:     Thyroid: No thyromegaly.  Cardiovascular:     Rate and Rhythm: Normal rate and regular rhythm.     Pulses: Normal pulses.     Heart sounds: Normal heart sounds. No murmur heard.    No friction rub.  Pulmonary:     Effort: Pulmonary effort is normal.     Breath sounds: Normal breath sounds. No wheezing, rhonchi or rales.  Abdominal:      General: Bowel sounds are normal.     Palpations: Abdomen is soft.     Tenderness: There is no abdominal tenderness.  Musculoskeletal:        General: No deformity. Normal range of motion.  Lymphadenopathy:     Cervical: No cervical adenopathy.  Skin:    General: Skin is warm and dry.     Findings: No lesion.  Neurological:     General: No focal deficit present.     Mental Status: She is alert and oriented to person, place, and time.  Psychiatric:        Mood and Affect: Mood normal.        Thought Content: Thought content normal.      No results found for any visits on 07/19/22.    Assessment & Plan:  Well adult exam -Anticipatory guidance given including wearing seatbelts, smoke detectors in the home, increasing physical activity, increasing p.o. intake of water and vegetables. -Labs -Hemogram and colonoscopy not yet indicated 2/2 age -Immunizations reviewed -Pap done with OB/GYN, 05/28/2021 -Next CPE in 1 year  Essential hypertension -Controlled -Continue olmesartan 10 mg Oneal -Continue lifestyle modifications -     Comprehensive metabolic panel; Future -  CBC with Differential/Platelet; Future -     TSH; Future -     T4, free -     Olmesartan Medoxomil; Take 0.5 tablets (10 mg total) by mouth Oneal.  Dispense: 45 tablet; Refill: 3  Attention deficit hyperactivity disorder (ADHD), predominantly inattentive type -Stable -PDMP reviewed -Refills provided -     Amphetamine-Dextroamphetamine; Take 1 tablet (10 mg total) by mouth 2 (two) times Oneal with a meal. Afternoon dose no later than 1 pm.  Dispense: 60 tablet; Refill: 0 -     Amphetamine-Dextroamphetamine; Take 1 tablet (10 mg total) by mouth 2 (two) times Oneal with a meal. Afternoon dose no later than 1 pm.  Dispense: 60 tablet; Refill: 0 -     Amphetamine-Dextroamphetamine; Take 1 tablet (10 mg total) by mouth 2 (two) times Oneal with a meal. Afternoon dose no later than 1 pm.  Dispense: 60 tablet; Refill:  0  Class 2 obesity due to excess calories without serious comorbidity with body mass index (BMI) of 37.0 to 37.9 in adult -Body mass index is 37.51 kg/m. -Continue lifestyle modifications -Discussed various medication options to assist with weight loss.  Patient to review and decide. -Patient to consider nutritionist or weight management referral. -     Comprehensive metabolic panel; Future -     CBC with Differential/Platelet; Future -     Lipid panel; Future -     TSH; Future -     T4, free -     VITAMIN D 25 Hydroxy (Vit-D Deficiency, Fractures)    Return in about 3 months (around 10/18/2022), or if symptoms worsen or fail to improve.   Billie Ruddy, MD

## 2022-10-03 DIAGNOSIS — J302 Other seasonal allergic rhinitis: Secondary | ICD-10-CM

## 2022-10-03 DIAGNOSIS — H9201 Otalgia, right ear: Secondary | ICD-10-CM | POA: Diagnosis not present

## 2022-10-03 DIAGNOSIS — M25522 Pain in left elbow: Secondary | ICD-10-CM | POA: Diagnosis not present

## 2022-10-03 DIAGNOSIS — M778 Other enthesopathies, not elsewhere classified: Secondary | ICD-10-CM

## 2022-10-03 NOTE — Progress Notes (Signed)
   Established Patient Office Visit   Subjective  Patient ID: Melissa Oneal, female    DOB: [birth year]  Age: 37 y.o. MRN: [id number]  Chief Complaint  Patient presents with  . Pain    Elbow, left pain, dull achy pain. When pulls, pushes, pinches, or picks up something, dull achy pain. Not stretch out, felt may have slept on it wrong, pain for 2-3 wks. Not etting better, has tried ibuprofen sometimes, tylenol did not help.  Right ear pain, may have been allergies-, not painful today. Had it for 3 or 4 days, more of a dull achy pain  Pt accompanied by her son Melissa Oneal.  HPI  {History (Optional):23778}  ROS Negative unless stated above    Objective:     BP 124/82 (BP Location: Right Arm, Patient Position: Sitting, Cuff Size: Large)   Pulse 80   Temp 98.4 F (36.9 C) (Oral)   Wt 236 lb (107 kg)   SpO2 100%   BMI 38.09 kg/m  {Vitals History (Optional):23777}  Physical Exam   No results found for any visits on 10/03/22.    Assessment & Plan:  Tendonitis of elbow, left  Left elbow pain  Right ear pain  Seasonal allergies    Return if symptoms worsen or fail to improve.   Deeann Saint, MD

## 2022-10-19 DIAGNOSIS — M25522 Pain in left elbow: Secondary | ICD-10-CM

## 2022-10-19 DIAGNOSIS — M778 Other enthesopathies, not elsewhere classified: Secondary | ICD-10-CM

## 2022-10-19 DIAGNOSIS — H6992 Unspecified Eustachian tube disorder, left ear: Secondary | ICD-10-CM | POA: Diagnosis not present

## 2022-10-19 DIAGNOSIS — H65192 Other acute nonsuppurative otitis media, left ear: Secondary | ICD-10-CM

## 2022-10-19 DIAGNOSIS — J029 Acute pharyngitis, unspecified: Secondary | ICD-10-CM

## 2022-10-19 MED ORDER — AZITHROMYCIN 250 MG PO TABS
ORAL_TABLET | ORAL | 0 refills | Status: AC
Start: 2022-10-19 — End: 2022-10-24

## 2022-10-19 MED ORDER — PREDNISONE 10 MG PO TABS
ORAL_TABLET | ORAL | 0 refills | Status: DC
Start: 2022-10-19 — End: 2023-07-11

## 2022-10-19 NOTE — Progress Notes (Signed)
Established Patient Office Visit   Subjective  Patient ID: Melissa Oneal Melissa Oneal, female    DOB: 11-23-1985  Age: 37 y.o. MRN: [id number]  Chief Complaint  Patient presents with   Pain    Throat pain and left ear pain since last visit. Has been taking allergy meds ,and some ibuprofen, nasal spray as well.  Elbow is also still hurting.     Patient is a 37 year old female seen for follow-up on ongoing concern.  Patient seen 10/03/2022 for otalgia, left elbow pain.  Patient currently having pain in left ear and along left side of neck.  Also notes left-sided sore throat with swallowing.  Pt with h/o tonsillectomy.  Tried Flonase and Xyzal since last visit.  Noticed improvement in congestion.  Patient denies fever, chills, facial pain/pressure, muffled hearing, popping in ears.  Patient has seen ENT in the past but typically unable to get an appointment while symptoms are occurring.  Patient with continued left elbow pain.  Pain with extension and flexion of forearm.  Also noted with turning hand and picking up items like her purse.  Patient tried topical analgesics, ibuprofen.  Denies edema, erythema, injury.      ROS Negative unless stated above    Objective:     BP 120/88 (BP Location: Right Arm, Patient Position: Sitting, Cuff Size: Large)   Pulse 90   Temp 97.8 F (36.6 C) (Temporal)   Wt 232 lb 12.8 oz (105.6 kg)   SpO2 98%   BMI 37.57 kg/m    Physical Exam Constitutional:      General: She is not in acute distress.    Appearance: Normal appearance.  HENT:     Head: Normocephalic and atraumatic.     Right Ear: Hearing, tympanic membrane, ear canal and external ear normal. No middle ear effusion.     Left Ear: Hearing, ear canal and external ear normal. A middle ear effusion is present.     Nose: Mucosal edema present.     Right Sinus: No maxillary sinus tenderness or frontal sinus tenderness.     Left Sinus: No maxillary sinus tenderness or frontal sinus  tenderness.     Comments: Mucosal edema and enlarged nasal turbinates left>R.    Mouth/Throat:     Mouth: Mucous membranes are moist.     Comments: Scant erythema midline pharynx.  No exudate.  Tonsils surgically absent. Cardiovascular:     Rate and Rhythm: Normal rate and regular rhythm.     Heart sounds: Normal heart sounds. No murmur heard.    No gallop.  Pulmonary:     Effort: Pulmonary effort is normal. No respiratory distress.     Breath sounds: Normal breath sounds. No wheezing, rhonchi or rales.  Skin:    General: Skin is warm and dry.  Neurological:     Mental Status: She is alert and oriented to person, place, and time.      No results found for any visits on 10/19/22.    Assessment & Plan:  Tendonitis of elbow, left -     predniSONE; Take 5 tabs on day 1, 4 tabs on day 2, 3 tabs on day 3, 2 tabs on day 4, 1 tab on day 5.  Dispense: 15 tablet; Refill: 0  Acute effusion of left ear -     Azithromycin; Take 2 tablets on day 1, then 1 tablet daily on days 2 through 5  Dispense: 6 tablet; Refill: 0 -     predniSONE; Take  5 tabs on day 1, 4 tabs on day 2, 3 tabs on day 3, 2 tabs on day 4, 1 tab on day 5.  Dispense: 15 tablet; Refill: 0  Left elbow pain -     predniSONE; Take 5 tabs on day 1, 4 tabs on day 2, 3 tabs on day 3, 2 tabs on day 4, 1 tab on day 5.  Dispense: 15 tablet; Refill: 0  Dysfunction of left eustachian tube  Sore throat -     Azithromycin; Take 2 tablets on day 1, then 1 tablet daily on days 2 through 5  Dispense: 6 tablet; Refill: 0  Ongoing concerns x 1 month.  Left ear with effusion likely from eustachian tube dysfunction due to allergic rhinitis.  Continue antihistamines.  Given sample of Zyrtec.  Discussed other supportive care including steam from shower, massaging side of face etc.  Start ABX given duration of symptoms.  For continued symptoms schedule follow-up with ENT.  Continued distal biceps tendinitis of left elbow.  Start prednisone taper.   Continue supportive care.  For continued symptoms refer to Ortho or sports medicine.   Return if symptoms worsen or fail to improve.   Deeann Saint, MD

## 2023-03-07 DIAGNOSIS — J019 Acute sinusitis, unspecified: Secondary | ICD-10-CM

## 2023-03-07 DIAGNOSIS — F900 Attention-deficit hyperactivity disorder, predominantly inattentive type: Secondary | ICD-10-CM

## 2023-03-07 DIAGNOSIS — J00 Acute nasopharyngitis [common cold]: Secondary | ICD-10-CM | POA: Diagnosis not present

## 2023-03-07 DIAGNOSIS — R051 Acute cough: Secondary | ICD-10-CM

## 2023-03-07 DIAGNOSIS — F419 Anxiety disorder, unspecified: Secondary | ICD-10-CM | POA: Diagnosis not present

## 2023-03-07 NOTE — Progress Notes (Signed)
Established Patient Office Visit   Subjective  Patient ID: Melissa Oneal Valerie Salts, female    DOB: 02/15/1986  Age: 37 y.o. MRN: [id number]  Chief Complaint  Patient presents with   Cough    Started 1 1/2 week ago, cough, post nasal drainage, back pain for coughing     Patient is a 37 year old female seen for acute concern.  Patient endorses cough, postnasal drainage x 1.5 weeks.  Patient states her son who is in daycare and her husband got sick but are better.  Patient notes coughing spells causing back pain.  Cough worse at night due to drainage.  No posttussive emesis.  Patient denies fever, chills, headache, ear pain/pressure, sore throat.  Tried Coricidin, Mucinex DM, and Flonase for symptoms.  Patient also mentions weight, feeling overwhelmed and more anxious causing difficulty to fall asleep at night.  Having a hard time deciphering the cause of symptoms; anxiety, depression, or ADHD.  Previously on Adderall 10 mg but forgetting to take 2nd dose.  Does not take med daily.      Patient Active Problem List   Diagnosis Date Noted   Attention deficit hyperactivity disorder (ADHD), predominantly inattentive type 12/12/2020   Eustachian tube dysfunction, right 11/24/2020   Allergic rhinitis 10/25/2020   Recurrent sinusitis 10/25/2020   Vacuum-assisted vaginal delivery 04/11/2018   Postpartum care following VAVD (12/25) 04/11/2018   Second degree perineal laceration 04/11/2018   Acute blood loss anemia 04/11/2018   Normal labor 04/09/2018   Strain of calf muscle 09/25/2016   Obesity 10/31/2015   Abdominal bloating 08/01/2015   Diarrhea 08/01/2015   Pain in right foot 08/01/2015   Acute maxillary sinusitis 04/24/2015   Sciatica 02/28/2015   Allergy 02/02/2015   Generalized anxiety disorder 02/02/2015   Tracheobronchitis 12/13/2014   Pharyngitis 12/13/2014   Anxiety state 07/18/2011   Benign hypertension 07/18/2011   Past Medical History:  Diagnosis Date   Anxiety     Hypertension    Past Surgical History:  Procedure Laterality Date   CHOLECYSTECTOMY     TONSILLECTOMY     Social History   Tobacco Use   Smoking status: Never   Smokeless tobacco: Never  Vaping Use   Vaping status: Never Used  Substance Use Topics   Alcohol use: Never   Drug use: Never   Family History  Problem Relation Age of Onset   Miscarriages / Stillbirths Mother    Dementia Mother    Cancer Paternal Aunt    Hypertension Maternal Grandfather    Diabetes Maternal Grandfather    Cancer Paternal Grandmother    Heart disease Paternal Grandmother    Hypertension Paternal Grandmother    Cancer Paternal Grandfather    Diabetes Paternal Grandfather    Dementia Maternal Grandmother    Allergies  Allergen Reactions   Cefuroxime Axetil Other (See Comments)   Cephalosporins Rash and Other (See Comments)    Other reaction(s): Rash      ROS Negative unless stated above    Objective:     BP 130/80 (BP Location: Right Arm, Patient Position: Sitting, Cuff Size: Large)   Pulse 86   Temp 98.6 F (37 C) (Oral)   Ht 5\' 6"  (1.676 m)   Wt 236 lb 3.2 oz (107.1 kg)   LMP  (LMP Unknown)   SpO2 97%   BMI 38.12 kg/m  BP Readings from Last 3 Encounters:  03/07/23 130/80  10/19/22 120/88  10/03/22 124/82   Wt Readings from Last 3 Encounters:  03/07/23  236 lb 3.2 oz (107.1 kg)  10/19/22 232 lb 12.8 oz (105.6 kg)  10/03/22 236 lb (107 kg)      Physical Exam Constitutional:      General: She is not in acute distress.    Appearance: Normal appearance.  HENT:     Head: Normocephalic and atraumatic.     Ears:     Comments: Bilateral TMs full without erythema, suppurative fluid, effusion.    Nose: Nose normal.     Mouth/Throat:     Mouth: Mucous membranes are moist.  Cardiovascular:     Rate and Rhythm: Normal rate and regular rhythm.     Heart sounds: Normal heart sounds. No murmur heard.    No gallop.  Pulmonary:     Effort: Pulmonary effort is normal. No  respiratory distress.     Breath sounds: Normal breath sounds. No wheezing, rhonchi or rales.     Comments: Intermittent cough. Lymphadenopathy:     Cervical: Cervical adenopathy present.  Skin:    General: Skin is warm and dry.  Neurological:     Mental Status: She is alert and oriented to person, place, and time.      03/07/2023   11:49 AM 10/03/2022    8:41 AM 07/19/2022    8:14 AM  Depression screen PHQ 2/9  Decreased Interest 0 0 0  Down, Depressed, Hopeless 0 0 0  PHQ - 2 Score 0 0 0  Altered sleeping 2 1 1   Tired, decreased energy 2 1 0  Change in appetite 2 0 0  Feeling bad or failure about yourself  0 0 0  Trouble concentrating 2 0 0  Moving slowly or fidgety/restless 0 0 0  Suicidal thoughts 0 0 0  PHQ-9 Score 8 2 1   Difficult doing work/chores Somewhat difficult Not difficult at all Not difficult at all       03/07/2023   11:50 AM 10/03/2022    8:41 AM 07/19/2022    8:14 AM 02/05/2022    3:17 PM  GAD 7 : Generalized Anxiety Score  Nervous, Anxious, on Edge 1 0 0 0  Control/stop worrying 1 0 0 0  Worry too much - different things 1 0 1 1  Trouble relaxing 2 0 1 1  Restless 2 0 0 0  Easily annoyed or irritable 1 0 1 1  Afraid - awful might happen 0 0 0 0  Total GAD 7 Score 8 0 3 3  Anxiety Difficulty Somewhat difficult  Not difficult at all Somewhat difficult      No results found for any visits on 03/07/23.    Assessment & Plan:  Acute sinusitis, recurrence not specified, unspecified location -     Azithromycin; Take 2 tablets on day 1, then 1 tablet daily on days 2 through 5  Dispense: 6 tablet; Refill: 0  Acute nasopharyngitis -supportive care with OTC meds, flonase, hydration, rest, etc.  Acute cough -     Benzonatate; Take 1 capsule (100 mg total) by mouth 2 (two) times daily as needed for cough.  Dispense: 20 capsule; Refill: 0  Attention deficit hyperactivity disorder (ADHD), predominantly inattentive type -PDMP reviewed and appropriate -will  d/c Adderall 10 mg BID as pt forgetting to take 2nd dose. -start Adderall ER 15 mg.  Advised not to take late as may cause insomnia. -monitor symptoms.   -f/u in 1 month.  If working well send in refills. -     Amphetamine-Dextroamphet ER; Take 1 capsule by mouth  every morning.  Dispense: 30 capsule; Refill: 0  Anxiety -GAD-7 score 8 PHQ-9 score 8 this visit -Hydroxyzine nightly as needed.  Does not cause drowsiness can take during the day if needed. -self care -     hydrOXYzine HCl; Take 1 tablet (25 mg total) by mouth at bedtime as needed (anxiety, sleep).  Dispense: 90 tablet; Refill: 1   Return in about 4 weeks (around 04/04/2023), or if symptoms worsen or fail to improve.   Deeann Saint, MD

## 2023-03-07 NOTE — Patient Instructions (Signed)
A prescription for tessalon (for cough) and Azithromycin (antibiotic for sinusitis) was sent to pharmacy.  A prescription for extended release Adderall 15 mg daily was also sent to your pharmacy.  DC if it works better.  Pt for hydroxyzine 25 mg was also sent to pharmacy.  The instructions on the bottle for use at night if needed for anxiety or sleep.

## 2023-05-18 DIAGNOSIS — F900 Attention-deficit hyperactivity disorder, predominantly inattentive type: Secondary | ICD-10-CM

## 2023-07-11 DIAGNOSIS — Z818 Family history of other mental and behavioral disorders: Secondary | ICD-10-CM | POA: Diagnosis not present

## 2023-07-11 DIAGNOSIS — E66812 Obesity, class 2: Secondary | ICD-10-CM

## 2023-07-11 DIAGNOSIS — I10 Essential (primary) hypertension: Secondary | ICD-10-CM | POA: Diagnosis not present

## 2023-07-11 DIAGNOSIS — Z6839 Body mass index (BMI) 39.0-39.9, adult: Secondary | ICD-10-CM

## 2023-07-11 DIAGNOSIS — F4322 Adjustment disorder with anxiety: Secondary | ICD-10-CM | POA: Diagnosis not present

## 2023-07-11 NOTE — Progress Notes (Signed)
 Established Patient Office Visit   Subjective  Patient ID: Melissa Oneal Valerie Salts, female    DOB: Aug 02, 1985  Age: 38 y.o. MRN: [id number]  Chief Complaint  Patient presents with   Hypertension   Anxiety    Patient is a 38 year old female seen for ongoing concerns.  Increased stress.  Mom with dementia.  Husband had complications during surgery.  Increased work load.  Caring for her young son.  Pt feeling overwhelmed.  Inquires about FMLA and overall options.  Patient previously given Rx for hydroxyzine for anxiety as needed however has not used.  Has prescription at home.  Recent increase in responsibilities makes it harder to eat healthier.  Pt expresses frustration with weight as working out consistently and cooking at home.    Patient Active Problem List   Diagnosis Date Noted   Attention deficit hyperactivity disorder (ADHD), predominantly inattentive type 12/12/2020   Eustachian tube dysfunction, right 11/24/2020   Allergic rhinitis 10/25/2020   Recurrent sinusitis 10/25/2020   Vacuum-assisted vaginal delivery 04/11/2018   Postpartum care following VAVD (12/25) 04/11/2018   Second degree perineal laceration 04/11/2018   Acute blood loss anemia 04/11/2018   Normal labor 04/09/2018   Strain of calf muscle 09/25/2016   Obesity 10/31/2015   Abdominal bloating 08/01/2015   Diarrhea 08/01/2015   Pain in right foot 08/01/2015   Acute maxillary sinusitis 04/24/2015   Sciatica 02/28/2015   Allergy 02/02/2015   Generalized anxiety disorder 02/02/2015   Tracheobronchitis 12/13/2014   Pharyngitis 12/13/2014   Anxiety state 07/18/2011   Benign hypertension 07/18/2011   Past Medical History:  Diagnosis Date   Anxiety    Hypertension    Past Surgical History:  Procedure Laterality Date   CHOLECYSTECTOMY     TONSILLECTOMY     Social History   Tobacco Use   Smoking status: Never   Smokeless tobacco: Never  Vaping Use   Vaping status: Never Used  Substance Use  Topics   Alcohol use: Never   Drug use: Never   Family History  Problem Relation Age of Onset   Miscarriages / Stillbirths Mother    Dementia Mother    Cancer Paternal Aunt    Hypertension Maternal Grandfather    Diabetes Maternal Grandfather    Cancer Paternal Grandmother    Heart disease Paternal Grandmother    Hypertension Paternal Grandmother    Cancer Paternal Grandfather    Diabetes Paternal Grandfather    Dementia Maternal Grandmother    Allergies  Allergen Reactions   Cefuroxime Axetil Other (See Comments)   Cephalosporins Rash and Other (See Comments)    Other reaction(s): Rash      ROS Negative unless stated above    Objective:     BP 122/74 (BP Location: Left Arm, Patient Position: Sitting, Cuff Size: Large)   Pulse 83   Temp 99 F (37.2 C) (Oral)   Wt 246 lb 3.2 oz (111.7 kg)   SpO2 98%   BMI 39.74 kg/m  BP Readings from Last 3 Encounters:  07/11/23 122/74  03/07/23 130/80  10/19/22 120/88   Wt Readings from Last 3 Encounters:  07/11/23 246 lb 3.2 oz (111.7 kg)  03/07/23 236 lb 3.2 oz (107.1 kg)  10/19/22 232 lb 12.8 oz (105.6 kg)      Physical Exam Constitutional:      General: She is not in acute distress.    Appearance: Normal appearance.  HENT:     Head: Normocephalic and atraumatic.     Nose: Nose  normal.     Mouth/Throat:     Mouth: Mucous membranes are moist.  Cardiovascular:     Rate and Rhythm: Normal rate and regular rhythm.     Heart sounds: Normal heart sounds. No murmur heard.    No gallop.  Pulmonary:     Effort: Pulmonary effort is normal.  Skin:    General: Skin is warm and dry.  Neurological:     Mental Status: She is alert and oriented to person, place, and time.  Psychiatric:        Mood and Affect: Mood normal.        Judgment: Judgment normal.      No results found for any visits on 07/11/23.     Assessment & Plan:  Adjustment disorder with anxious mood -     buPROPion HCl ER (XL); Take 1 tablet (150  mg total) by mouth daily.  Dispense: 30 tablet; Refill: 3  Essential hypertension  Class 2 severe obesity with serious comorbidity and body mass index (BMI) of 39.0 to 39.9 in adult, unspecified obesity type (HCC)  Family history of dementia  Will start Wellbutrin XL 150 mg daily.  Patient has Rx for hydroxyzine at home but is never used.  Will try half a tab to see if it causes drowsiness.  If not can use as needed during the day for anxiety.  Patient to obtain FMLA forms for her husband.  Will upload to MyChart for completion.  Obtain labs in 3 weeks at upcoming CPE.  Patient can inquire with insurance company which if any medications are covered for weight loss.  Consider referral to weight management.  Continue olmesartan 10 mg daily for HTN.  Continue lifestyle modifications.   No follow-ups on file.  CPE scheduled in 3 weeks  Deeann Saint, MD

## 2023-07-15 DIAGNOSIS — Z0279 Encounter for issue of other medical certificate: Secondary | ICD-10-CM

## 2023-07-19 NOTE — Telephone Encounter (Signed)
 FMLA paperwork has been faxed and patient is aware

## 2023-08-02 DIAGNOSIS — F4322 Adjustment disorder with anxiety: Secondary | ICD-10-CM

## 2023-08-18 DIAGNOSIS — I10 Essential (primary) hypertension: Secondary | ICD-10-CM

## 2023-08-19 DIAGNOSIS — Z6839 Body mass index (BMI) 39.0-39.9, adult: Secondary | ICD-10-CM

## 2023-08-19 DIAGNOSIS — E6609 Other obesity due to excess calories: Secondary | ICD-10-CM

## 2023-08-19 DIAGNOSIS — I10 Essential (primary) hypertension: Secondary | ICD-10-CM | POA: Diagnosis not present

## 2023-08-19 DIAGNOSIS — F4322 Adjustment disorder with anxiety: Secondary | ICD-10-CM | POA: Diagnosis not present

## 2023-08-19 DIAGNOSIS — Z0000 Encounter for general adult medical examination without abnormal findings: Secondary | ICD-10-CM

## 2023-08-19 DIAGNOSIS — E66812 Obesity, class 2: Secondary | ICD-10-CM | POA: Diagnosis not present

## 2023-08-19 DIAGNOSIS — F900 Attention-deficit hyperactivity disorder, predominantly inattentive type: Secondary | ICD-10-CM | POA: Diagnosis not present

## 2023-08-19 NOTE — Progress Notes (Signed)
 Established Patient Office Visit   Subjective  Patient ID: Melissa Oneal Luvenia Salvage, female    DOB: 15-Aug-1985  Age: 38 y.o. MRN: [id number]  Chief Complaint  Patient presents with   Annual Exam    Patient is a 38 year old female seen for CPE and follow-up.  Patient is fasting.  Did not take medications this morning.  Patient states she is doing well.  Taking Wellbutrin  XL 150 mg daily.  Unsure if at appropriate dose.  Interested in increasing giving return to work and other changes in family life.  Patient's mother will be selling her home soon and moving to be closer by.  Patient's husband's health is improving.  Unsure if needs refill on Adderall for this month.  Had Pap at [facility].    Patient Active Problem List   Diagnosis Date Noted   Attention deficit hyperactivity disorder (ADHD), predominantly inattentive type 12/12/2020   Eustachian tube dysfunction, right 11/24/2020   Allergic rhinitis 10/25/2020   Recurrent sinusitis 10/25/2020   Vacuum-assisted vaginal delivery 04/11/2018   Postpartum care following VAVD (12/25) 04/11/2018   Second degree perineal laceration 04/11/2018   Acute blood loss anemia 04/11/2018   Normal labor 04/09/2018   Strain of calf muscle 09/25/2016   Obesity 10/31/2015   Abdominal bloating 08/01/2015   Diarrhea 08/01/2015   Pain in right foot 08/01/2015   Acute maxillary sinusitis 04/24/2015   Sciatica 02/28/2015   Allergy 02/02/2015   Generalized anxiety disorder 02/02/2015   Tracheobronchitis 12/13/2014   Pharyngitis 12/13/2014   Anxiety state 07/18/2011   Benign hypertension 07/18/2011   Past Medical History:  Diagnosis Date   Allergy    Anxiety    Hypertension    Past Surgical History:  Procedure Laterality Date   CHOLECYSTECTOMY     TONSILLECTOMY     Social History   Tobacco Use   Smoking status: Never   Smokeless tobacco: Never  Vaping Use   Vaping status: Never Used  Substance Use Topics   Alcohol use: Never    Drug use: Never   Family History  Problem Relation Age of Onset   Miscarriages / Stillbirths Mother    Dementia Mother    Cancer Paternal Aunt    Hypertension Maternal Grandfather    Diabetes Maternal Grandfather    Cancer Paternal Grandmother    Heart disease Paternal Grandmother    Hypertension Paternal Grandmother    Diabetes Paternal Grandmother    Cancer Paternal Grandfather    Diabetes Paternal Grandfather    Heart disease Paternal Grandfather    Dementia Maternal Grandmother    Diabetes Maternal Grandmother    Hypertension Father    Cancer Maternal Aunt    Allergies  Allergen Reactions   Cefuroxime Axetil Other (See Comments)   Cephalosporins Rash and Other (See Comments)    Other reaction(s): Rash      ROS Negative unless stated above    Objective:     BP (!) 148/82 (BP Location: Left Arm, Patient Position: Sitting, Cuff Size: Normal) Comment: No meds  Pulse 75   Temp 98.7 F (37.1 C) (Oral)   Ht 5\' 6"  (1.676 m)   Wt 242 lb (109.8 kg)   LMP  (LMP Unknown)   SpO2 97%   BMI 39.06 kg/m  BP Readings from Last 3 Encounters:  08/19/23 (!) 148/82  07/11/23 122/74  03/07/23 130/80   Wt Readings from Last 3 Encounters:  08/19/23 242 lb (109.8 kg)  07/11/23 246 lb 3.2 oz (111.7 kg)  03/07/23 236 lb 3.2 oz (107.1 kg)      Physical Exam Constitutional:      Appearance: Normal appearance.  HENT:     Head: Normocephalic and atraumatic.     Right Ear: Tympanic membrane, ear canal and external ear normal.     Left Ear: Tympanic membrane, ear canal and external ear normal.     Nose: Nose normal.     Mouth/Throat:     Mouth: Mucous membranes are moist.     Pharynx: No oropharyngeal exudate or posterior oropharyngeal erythema.  Eyes:     General: No scleral icterus.    Extraocular Movements: Extraocular movements intact.     Conjunctiva/sclera: Conjunctivae normal.     Pupils: Pupils are equal, round, and reactive to light.  Neck:     Thyroid : No  thyromegaly.  Cardiovascular:     Rate and Rhythm: Normal rate and regular rhythm.     Pulses: Normal pulses.     Heart sounds: Normal heart sounds. No murmur heard.    No friction rub.  Pulmonary:     Effort: Pulmonary effort is normal.     Breath sounds: Normal breath sounds. No wheezing, rhonchi or rales.  Abdominal:     General: Bowel sounds are normal.     Palpations: Abdomen is soft.     Tenderness: There is no abdominal tenderness.  Musculoskeletal:        General: No deformity. Normal range of motion.  Lymphadenopathy:     Cervical: No cervical adenopathy.  Skin:    General: Skin is warm and dry.     Findings: No lesion.  Neurological:     General: No focal deficit present.     Mental Status: She is alert and oriented to person, place, and time.  Psychiatric:        Mood and Affect: Mood normal.        Thought Content: Thought content normal.        08/19/2023   11:13 AM 03/07/2023   11:49 AM 10/03/2022    8:41 AM  Depression screen PHQ 2/9  Decreased Interest 0 0 0  Down, Depressed, Hopeless 1 0 0  PHQ - 2 Score 1 0 0  Altered sleeping 2 2 1   Tired, decreased energy 1 2 1   Change in appetite 0 2 0  Feeling bad or failure about yourself  0 0 0  Trouble concentrating 0 2 0  Moving slowly or fidgety/restless 0 0 0  Suicidal thoughts 0 0 0  PHQ-9 Score 4 8 2   Difficult doing work/chores Somewhat difficult Somewhat difficult Not difficult at all      08/19/2023   11:13 AM 03/07/2023   11:50 AM 10/03/2022    8:41 AM 07/19/2022    8:14 AM  GAD 7 : Generalized Anxiety Score  Nervous, Anxious, on Edge 2 1 0 0  Control/stop worrying 2 1 0 0  Worry too much - different things 2 1 0 1  Trouble relaxing 3 2 0 1  Restless 3 2 0 0  Easily annoyed or irritable 2 1 0 1  Afraid - awful might happen 0 0 0 0  Total GAD 7 Score 14 8 0 3  Anxiety Difficulty Somewhat difficult Somewhat difficult  Not difficult at all     No results found for any visits on 08/19/23.     Assessment & Plan:  Well adult exam -     CBC with Differential/Platelet; Future -  Comprehensive metabolic panel with GFR; Future -     Hemoglobin A1c; Future -     Lipid panel; Future -     TSH; Future -     T4, free; Future  Essential hypertension -     Comprehensive metabolic panel with GFR; Future  Class 2 obesity due to excess calories without serious comorbidity with body mass index (BMI) of 39.0 to 39.9 in adult -     Comprehensive metabolic panel with GFR; Future -     Hemoglobin A1c; Future -     Lipid panel; Future -     TSH; Future -     T4, free; Future -     VITAMIN D  25 Hydroxy (Vit-D Deficiency, Fractures); Future  Adjustment disorder with anxious mood -     TSH; Future -     T4, free; Future -     buPROPion  HCl ER (XL); Take 1 tablet (300 mg total) by mouth daily.  Dispense: 90 tablet; Refill: 1  Attention deficit hyperactivity disorder (ADHD), predominantly inattentive type -     Amphetamine -Dextroamphet ER; Take 1 capsule by mouth every morning.  Dispense: 30 capsule; Refill: 0 -     Amphetamine -Dextroamphet ER; Take 1 capsule by mouth every morning.  Dispense: 30 capsule; Refill: 0 -     Amphetamine -Dextroamphet ER; Take 1 capsule by mouth every morning.  Dispense: 30 capsule; Refill: 0  Age-appropriate health screenings discussed.  Will obtain labs.  Immunizations reviewed.  Will obtain records from [facility] regarding updated Pap.  Mammogram and colonoscopy not yet indicated 2/2 age.  PHQ-9 score 4 and GAD-7 score 14 this visit.  Increase Wellbutrin  XL from 150 mg to 300 mg daily.  Continue self-care.  Continue lifestyle modifications.  Congratulate on 4 lbs weight loss.  BMI now 39.06 kg/m.  Recheck BP.  Likely elevated 2/2 not taking medications this morning while fasting.  Refills for Adderall ER 15 mg x 3 months sent to pharmacy.  PDMP reviewed and appropriate.  Return for follow up in 6-8 wks for anxiety med.   Viola Greulich, MD

## 2023-09-12 DIAGNOSIS — B9689 Other specified bacterial agents as the cause of diseases classified elsewhere: Secondary | ICD-10-CM | POA: Diagnosis not present

## 2023-09-12 DIAGNOSIS — J019 Acute sinusitis, unspecified: Secondary | ICD-10-CM | POA: Diagnosis not present

## 2023-09-12 NOTE — Progress Notes (Signed)

## 2023-09-12 NOTE — Progress Notes (Signed)
 I have spent 5 minutes in review of e-visit questionnaire, review and updating patient chart, medical decision making and response to patient.   Piedad Climes, PA-C

## 2023-10-15 NOTE — Telephone Encounter (Signed)
 FYI Only or Action Required?: Action required by provider: request for appointment.  Patient was last seen in primary care on 08/19/2023 by Mercer Clotilda SAUNDERS, MD. Called Nurse Triage reporting Dizziness. Symptoms began 2 weeks ago. Interventions attempted: Rest, hydration, or home remedies. Symptoms are: unchanged.  Triage Disposition: See PCP When Office is Open (Within 3 Days)-patient requesting  Patient/caregiver understands and will follow disposition?:   Copied from CRM [id number]. Topic: Clinical - Red Word Triage >> Oct 15, 2023  4:35 PM Lavanda D wrote: Red Word that prompted transfer to Nurse Triage: Light-headedness: Patient experiences back of head throbbing/light-headedness when she stands up, doesn't happen all the time but happens enough to concern her. Last happened today. Reason for Disposition  [1] MILD dizziness (e.g., walking normally) AND [2] has NOT been evaluated by doctor (or NP/PA) for this  (Exception: Dizziness caused by heat exposure, sudden standing, or poor fluid intake.)  Answer Assessment - Initial Assessment Questions 1. DESCRIPTION: Describe your dizziness.     Lightheadedness occurs with a headache in the back of head which passes within 5-10 seconds 2. LIGHTHEADED: Do you feel lightheaded? (e.g., somewhat faint, woozy, weak upon standing)     yes 3. VERTIGO: Do you feel like either you or the room is spinning or tilting? (i.e. vertigo)     no 4. SEVERITY: How bad is it?  Do you feel like you are going to faint? Can you stand and walk?   - MILD: Feels slightly dizzy, but walking normally.   - MODERATE: Feels unsteady when walking, but not falling; interferes with normal activities (e.g., school, work).   - SEVERE: Unable to walk without falling, or requires assistance to walk without falling; feels like passing out now.      Mild 5. ONSET:  When did the dizziness begin?     Two weeks 6. AGGRAVATING FACTORS: Does anything make it worse? (e.g.,  standing, change in head position)     standing 7. HEART RATE: Can you tell me your heart rate? How many beats in 15 seconds?  (Note: not all patients can do this)       N/a 8. CAUSE: What do you think is causing the dizziness?     unsure 9. RECURRENT SYMPTOM: Have you had dizziness before? If Yes, ask: When was the last time? What happened that time?     no 10. OTHER SYMPTOMS: Do you have any other symptoms? (e.g., fever, chest pain, vomiting, diarrhea, bleeding)       no 11. PREGNANCY: Is there any chance you are pregnant? When was your last menstrual period?       No  Patient requesting for a phone call from PCP in regards to scheduling. Patient was given options to see other providers in the next couple of days but patient wasn't available. Patient would like recommendations from PCP. Patient is asking for a phone call from staff.  Protocols used: Dizziness - Lightheadedness-A-AH

## 2023-10-16 NOTE — Telephone Encounter (Signed)
 Called patient left a VM to get her Sch to see a provider in the office

## 2023-11-27 VITALS — BP 142/70 | HR 102 | Temp 100.2°F | Ht 66.0 in | Wt 231.0 lb

## 2023-11-27 DIAGNOSIS — R21 Rash and other nonspecific skin eruption: Secondary | ICD-10-CM

## 2023-11-27 DIAGNOSIS — R509 Fever, unspecified: Secondary | ICD-10-CM

## 2023-11-27 DIAGNOSIS — R291 Meningismus: Secondary | ICD-10-CM

## 2023-11-27 DIAGNOSIS — R000 Tachycardia, unspecified: Secondary | ICD-10-CM

## 2024-01-21 IMAGING — DX DG WRIST COMPLETE 3+V*L*
3 series · 3 of 3 positions shown · non-contrast
Comparison: None.

CLINICAL DATA: Left posterolateral hand pain and edema. Left 2nd
digit edema and pain, status post laceration 2 months ago.

EXAM:
LEFT WRIST - COMPLETE 3+ VIEW

[wrist pa]
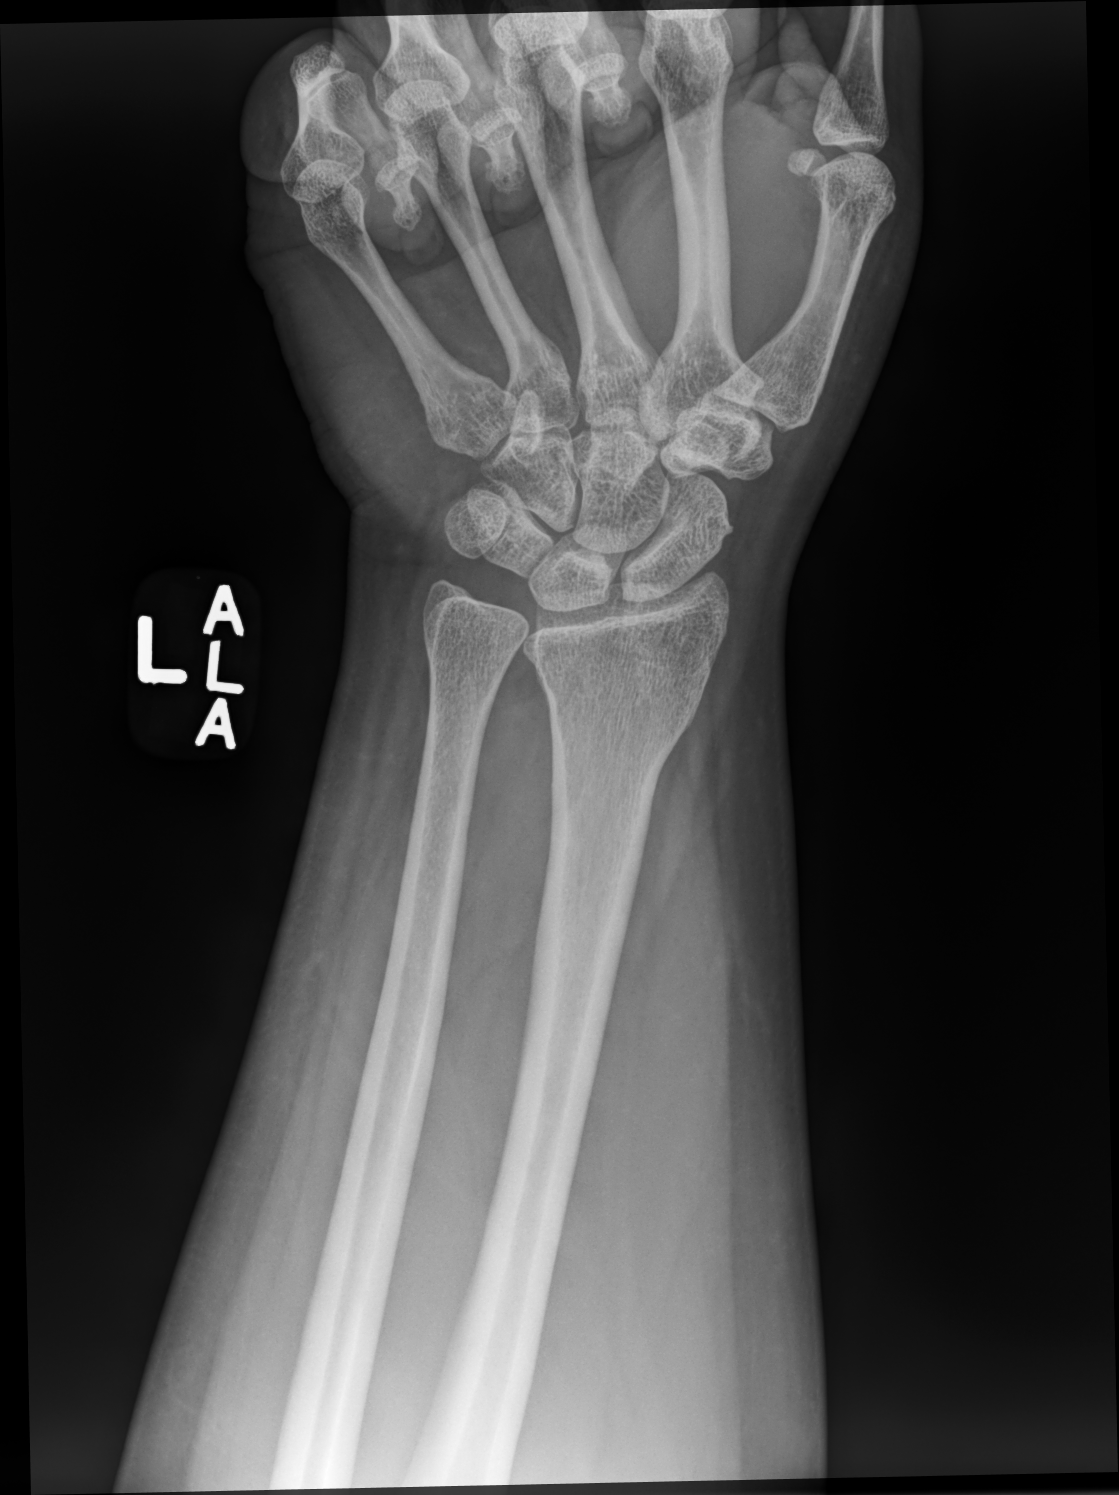

[wrist mlo]
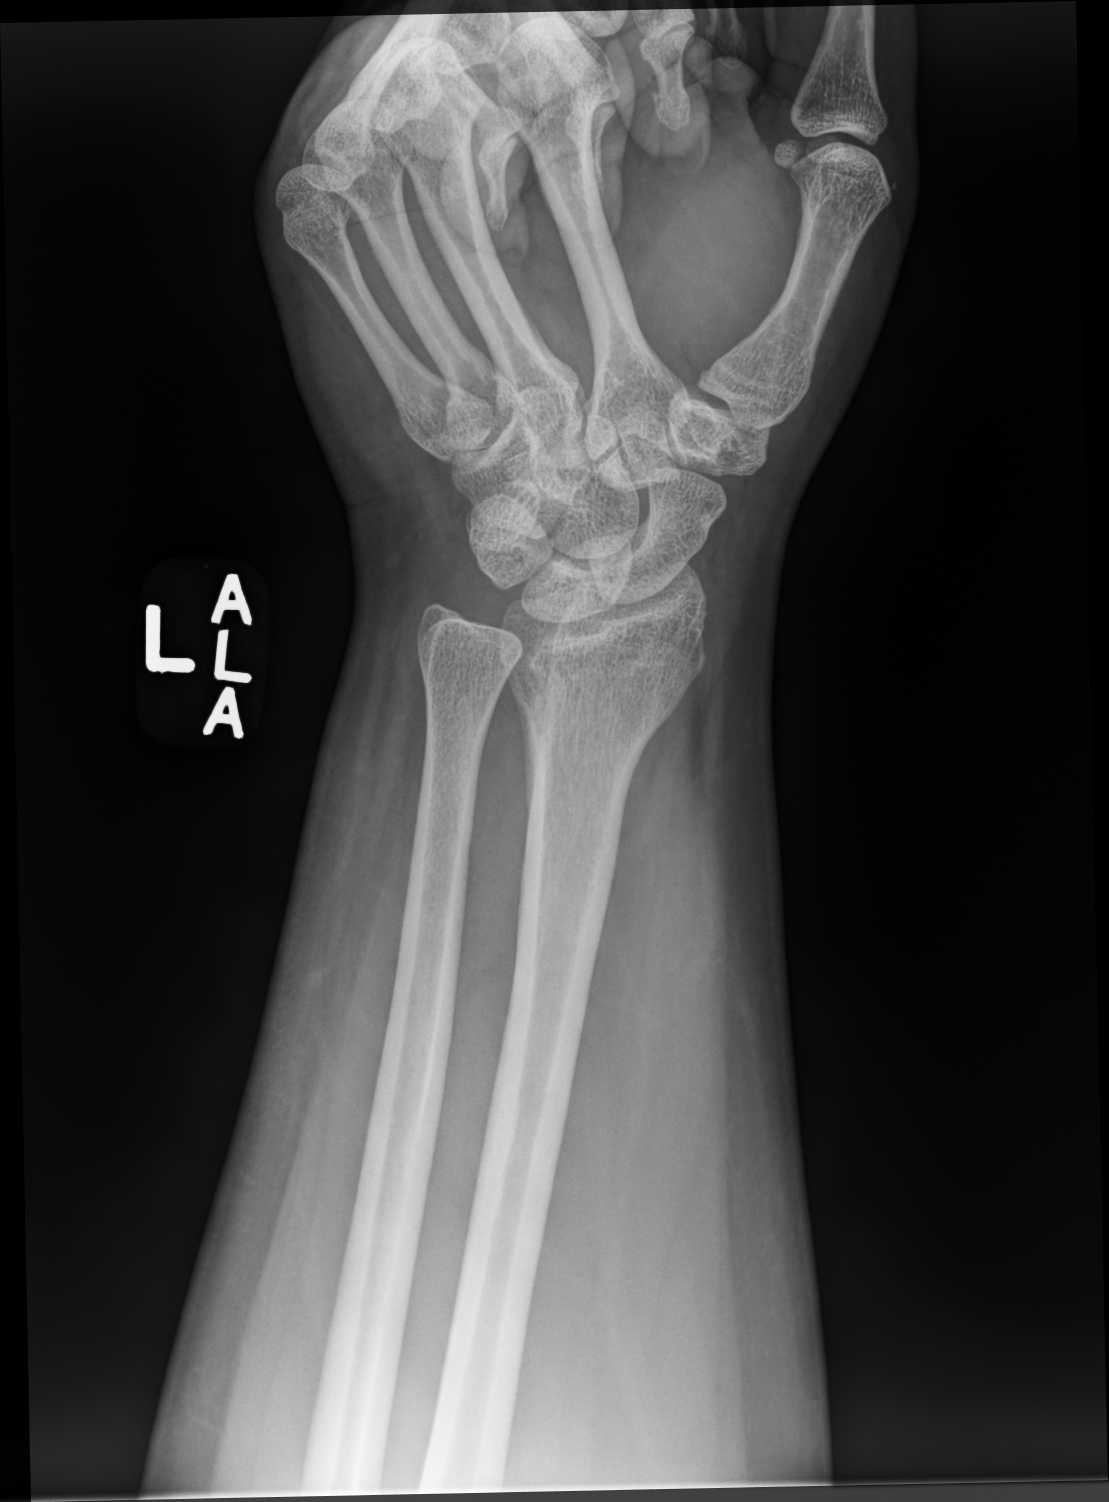

[wrist lat]
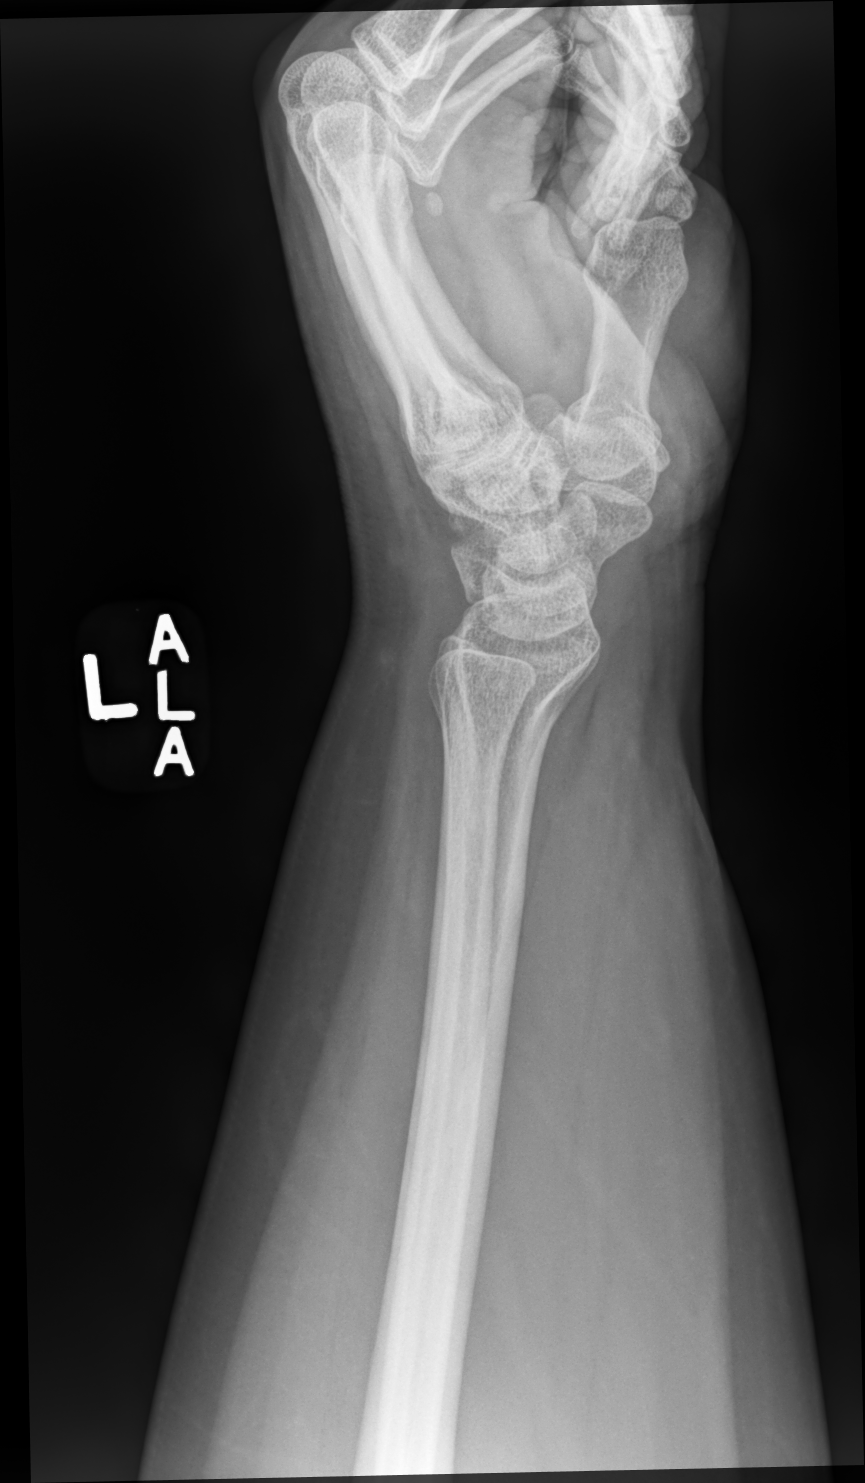

[3 of 3 positions shown; findings below may reference images not displayed]

FINDINGS: There is no evidence of fracture or dislocation. There is no
evidence of arthropathy or other focal bone abnormality. Soft
tissues are unremarkable.
IMPRESSION: Negative.
